# Patient Record
Sex: Female | Born: 1985
Health system: Southern US, Community
[De-identification: ages and names within clinical notes are randomized; demographics above are authoritative.]

## PROBLEM LIST (undated history)

## (undated) DIAGNOSIS — R300 Dysuria: Secondary | ICD-10-CM

## (undated) DIAGNOSIS — J189 Pneumonia, unspecified organism: Secondary | ICD-10-CM

## (undated) DIAGNOSIS — N3010 Interstitial cystitis (chronic) without hematuria: Secondary | ICD-10-CM

## (undated) DIAGNOSIS — U071 COVID-19: Secondary | ICD-10-CM

## (undated) DIAGNOSIS — R519 Headache, unspecified: Secondary | ICD-10-CM

## (undated) DIAGNOSIS — J40 Bronchitis, not specified as acute or chronic: Secondary | ICD-10-CM

## (undated) DIAGNOSIS — Z8711 Personal history of peptic ulcer disease: Secondary | ICD-10-CM

## (undated) DIAGNOSIS — F410 Panic disorder [episodic paroxysmal anxiety] without agoraphobia: Secondary | ICD-10-CM

## (undated) DIAGNOSIS — R32 Unspecified urinary incontinence: Secondary | ICD-10-CM

## (undated) HISTORY — PX: PILONIDAL CYST EXCISION: SHX744

## (undated) HISTORY — PX: BREAST SURGERY: SHX581

---
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
## 2003-02-02 ENCOUNTER — Encounter: Payer: Self-pay | Admitting: Family Medicine

## 2003-02-02 ENCOUNTER — Encounter: Admission: RE | Admit: 2003-02-02 | Discharge: 2003-02-02 | Payer: Self-pay | Admitting: Family Medicine

## 2003-02-27 ENCOUNTER — Emergency Department (HOSPITAL_COMMUNITY): Admission: EM | Admit: 2003-02-27 | Discharge: 2003-02-28 | Payer: Self-pay | Admitting: Emergency Medicine

## 2003-02-28 ENCOUNTER — Encounter: Payer: Self-pay | Admitting: *Deleted

## 2003-03-15 ENCOUNTER — Encounter: Payer: Self-pay | Admitting: Family Medicine

## 2003-03-15 ENCOUNTER — Encounter: Admission: RE | Admit: 2003-03-15 | Discharge: 2003-03-15 | Payer: Self-pay | Admitting: Family Medicine

## 2003-03-23 ENCOUNTER — Encounter: Admission: RE | Admit: 2003-03-23 | Discharge: 2003-03-23 | Payer: Self-pay | Admitting: Gastroenterology

## 2003-03-23 ENCOUNTER — Encounter: Payer: Self-pay | Admitting: Gastroenterology

## 2003-11-14 ENCOUNTER — Encounter (INDEPENDENT_AMBULATORY_CARE_PROVIDER_SITE_OTHER): Payer: Self-pay | Admitting: Specialist

## 2003-11-14 ENCOUNTER — Ambulatory Visit (HOSPITAL_COMMUNITY): Admission: RE | Admit: 2003-11-14 | Discharge: 2003-11-14 | Payer: Self-pay | Admitting: Obstetrics and Gynecology

## 2004-03-07 ENCOUNTER — Other Ambulatory Visit: Admission: RE | Admit: 2004-03-07 | Discharge: 2004-03-07 | Payer: Self-pay | Admitting: Obstetrics and Gynecology

## 2004-06-07 ENCOUNTER — Emergency Department (HOSPITAL_COMMUNITY): Admission: EM | Admit: 2004-06-07 | Discharge: 2004-06-08 | Payer: Self-pay | Admitting: Family Medicine

## 2006-05-19 ENCOUNTER — Encounter: Admission: RE | Admit: 2006-05-19 | Discharge: 2006-05-19 | Payer: Self-pay | Admitting: Family Medicine

## 2006-09-17 ENCOUNTER — Emergency Department (HOSPITAL_COMMUNITY): Admission: EM | Admit: 2006-09-17 | Discharge: 2006-09-17 | Payer: Self-pay | Admitting: Emergency Medicine

## 2008-09-16 ENCOUNTER — Encounter: Admission: RE | Admit: 2008-09-16 | Discharge: 2008-09-16 | Payer: Self-pay | Admitting: Neurology

## 2008-11-29 ENCOUNTER — Emergency Department (HOSPITAL_COMMUNITY): Admission: EM | Admit: 2008-11-29 | Discharge: 2008-11-29 | Payer: Self-pay | Admitting: Emergency Medicine

## 2011-01-23 NOTE — Op Note (Signed)
NAME:  Dana Giles, TECSON                        ACCOUNT NO.:  [id number]   MEDICAL RECORD NO.:  [id number]                   PATIENT TYPE:  AMB   LOCATION:  SDC                                  FACILITY:  [facility]   PHYSICIAN:  Dineen Kid. Rana Snare, M.D.                 DATE OF BIRTH:  1986/01/15   DATE OF PROCEDURE:  11/14/2003  DATE OF DISCHARGE:                                 OPERATIVE REPORT   PREOPERATIVE DIAGNOSIS:  Intrauterine pregnancy at seven weeks, desires  termination of pregnancy.   POSTOPERATIVE DIAGNOSIS:  Intrauterine pregnancy at seven weeks, desires  termination of pregnancy.   PROCEDURE:  Dilation and evacuation.   SURGEON:  Dineen Kid. Rana Snare, M.D.   ANESTHESIA:  Monitored anesthesia care and paracervical block.   INDICATIONS FOR PROCEDURE:  Ms. Ourada is a 25 year old primigravida who  finds herself unexpectedly pregnant.  She was counseled extensively on  options whether to carry the pregnancy, put the baby up for adoption or  terminate the pregnancy.  At this point, she desires termination of  pregnancy.  Risks and benefits of the procedure were discussed at length  which include, but not limited to, risk of infection, bleeding, damage to  uterus, tubes, ovaries bowel or bladder.  Her blood type is B positive.  She  does give informed consent.   DESCRIPTION OF PROCEDURE:  After adequate analgesia, the patient placed in  the dorsal lithotomy position.  She was sterilely prepped and draped.  Bladder was sterilely drained.  Graves speculum was placed.  Tenaculum was  placed in the anterior lip of the cervix and paracervical block was placed  with 1% Xylocaine, 1:100,000 epinephrine, 20 mL total used.  Uterus sounded  to 10 cm, easily dilated to a #29 Pratt dilator, 8 mm suction curette was  inserted.  Products of conception were retrieved.  This was performed until  the endometrial cavity was empty and gritty surface felt throughout the  endometrial cavity when sounding  with the curette.  Methergine 0.2 mg IM was  given with good uterine response.  The curette was then removed.  Tenaculum  removed from the cervix and noted to be hemostatic.  Speculum was then  removed.  The patient was transferred to the recovery room in stable  condition.  The estimated blood loss was less than 20 mL.  The patient also  received Toradol 30 mg IV at the end of the procedure.   DISPOSITION:  The patient will be discharged to home.  She will follow up in  the office in two to three weeks.  Sent home with a routine instruction  sheet for D&C and a prescription for Methergine 0.2 mg to take every eight  hours for two days and doxycycline 100 mg p.o. b.i.d. x7 days.  She is to  return to the office in two to three weeks. Call or return for increased  pain, fever,  or bleeding.                                               Dineen Kid Rana Snare, M.D.    DCL/MEDQ  D:  11/14/2003  T:  11/15/2003  Job:  [id number]

## 2011-01-23 NOTE — Op Note (Signed)
[facility]  Patient:    Dana Giles, Dana Giles                     MRN: [id number] Proc. Date: 04/13/01 Adm. Date:  [id number] Attending:  Trevor Iha                           Operative Report  PREOPERATIVE DIAGNOSIS:       Pelvic pain.  POSTOPERATIVE DIAGNOSIS:      Pelvic pain.  PROCEDURE:                    Diagnostic laparoscopy.  SURGEON:                      Trevor Iha, M.D.  ANESTHESIA:                   General endotracheal.  ESTIMATED BLOOD LOSS:         Less than 10 cc.  INDICATIONS:                  Ms. Perfetti is a 25 year old, nulligravida white female with worsening pelvic pain, intermittent and stabbing nature, and normal ultrasound and pelvic exam. Because of persistent and worsening symptoms despite conservative management with anti-inflammatory medications and oral contraceptive agents, plan laparoscopic evaluation and/or treatment of this discomfort. Risk and benefits were discussed at length. Informed consent was obtained. See history and physical for further details.  FINDINGS:                     Normal pelvis, appendix, liver, and gallbladder. The uterus was normal size, shape, and contour. The ovaries and tubes were very normal. Ovarian fossa, cul-de-sac, uterosacral ligaments were free to endometriosis, normal in appearance. Bowel: the descending colon did have some nodularity consistent with constipation.  DESCRIPTION OF PROCEDURE:     After adequate analgesia, the patient was placed in dorsal lithotomy position. She was sterilely prepped and draped. The bladder was sterilely drained. A Hulka tenaculum was placed on the anterior lip of the cervix. A 1 cm infraumbilical skin incision was made. A Veress needle was inserted. The abdomen was insufflated to dullness percussion. The 11 mm trocar was inserted. The 5 mm trocar was inserted to the left of the midline under direct visualization. It was two  fingerbreadths above the pubis symphysis. Examination of the intra-abdominal contents revealed the above findings, which were normal in appearance. After careful examination, the trocar was removed, the abdomen was desufflated. The infraumbilical skin incision was closed with a 0 Vicryl in the fascia and a figure-of-eight suture, and a 3-0 Vicryl Rapide subcuticular stitch in the skin. The 5 mm trocar site was closed with similar fashion with a 0 Vicryl in the fascia and a 3-0 Vicryl Rapide subcuticular stitch in the skin. Both incisions were injected with 0.25% Marcaine. The Hulka tenaculum was removed and noted to be hemostatic. The patient tolerated the procedure well, was stable, and transferred to recovery room. Sponge, needle, and instrument count was normal x 3. Estimated blood loss was less than 10 cc.  DISPOSITION:                  The patient will be discharged home to follow up in the office in two to three weeks. She was sent home with a routine instruction sheet for laparoscopy. She already has  a prescription for Ibuprofen and Darvocet. DD:  04/13/01 TD:  04/13/01 Job: [id number]

## 2011-01-23 NOTE — Consult Note (Signed)
NAME:  Dana Giles, Dana Giles              ACCOUNT NO.:  [id number]   MEDICAL RECORD NO.:  [id number]          PATIENT TYPE:  EMS   LOCATION:  MAJO                         FACILITY:  [facility]   PHYSICIAN:  Corinna L. Lendell Caprice, MDDATE OF BIRTH:  March 20, 1986   DATE OF CONSULTATION:  09/17/2006  DATE OF DISCHARGE:                                 CONSULTATION   REQUESTING PHYSICIAN:  Shelda Jakes, M.D.   REASON FOR CONSULTATION:  Multiple medication ingestion, evaluate for  admission.   IMPRESSIONS/RECOMMENDATIONS:  1. Multiple medication ingestion resulting in altered mental status,      improved.  Dr. Deretha Emory was concerned in particular bowel be      acetaminophen ingestion.  I spoke again with Poison Control about      her ingestions and given the fact that her liver function tests,      coagulation panel, and Tylenol level are normal they report that      the patient does not need Mucomyst.  She has received a single dose      here in the emergency room but we will not give her any further.      Her acetaminophen level is approximately 13-hour level and it is      not toxic.  With respect to the other ingestions including      chlorpheniramine, dextromethorphan, Ambien, phenylephrine, and      Mucinex, Poison Control recommends only monitoring until her blood      pressure and heart rate normalize.  Initially, her blood pressure      was 159/96 and her pulse was 140.  Currently, her blood pressure is      135/80, and her heart rate is 100.  The patient is alert and      oriented.  She is able to ambulate and wishes to go home.  I feel      that this can be managed at home as long as she takes no further      Tylenol products or cough and cold products.  She did have some      nausea but this is better.  I will give 12.5 mg of Phenergan as      needed, 6 were dispense but I instructed her to use it only if she      is symptomatic.  2. Acute sinusitis.  I recommend Afrin for 3-5 days  and followup with      Dr. Foy Guadalajara next week.   HISTORY OF PRESENT ILLNESS:  [name] is a 25 year old white female  who was brought to the emergency room by her mother.  She reportedly has  had a lot of sinus pressure, congestion, rhinorrhea, postnasal drip and  felt she had a head cold.  She took one Vicodin yesterday for shoulder  pain.  She also took two __________  Cold and Sinus medications.  She  took an Ambien last night and then does not recall what happened after  that.  The mother noted that she had an empty box of Tylenol product  which she had just bought yesterday, that 80  tablets were missing of  Tylenol 325, chlorpheniramine 2 mg, dextromethorphan 10, phenylephrine  5.  The mother reports she also took two Mucinex D yesterday and there  were 10 Ambien tablets missing that were just filled yesterday.  She  also had an empty bottle of Delsym which is 148 mL.  Apparently, there  is 30 mg of dextromethorphan per 5 mL.  The patient reports that she  does sometimes make calls and sleep walk when she takes the Ambien but  has continued to do so due to insomnia.  She vomited once this morning.  She feels a little weak and slightly nauseated but his back to her same  mental status according to the mother.  She denies any suicidal  ideation.  Her main complaint is the sinus issues.  She has already been  up and about to the bathroom without difficulty twice.   PAST MEDICAL HISTORY:  1. She has had a breast reduction.  2. She has chronic pain issues, migraines.   MEDICATIONS:  As above, otherwise none.   NO KNOWN DRUG ALLERGIES.   SOCIAL HISTORY:  She smokes __________ pack of cigarettes every week.  She drinks occasionally.  She denies drug use.   FAMILY HISTORY:  Noncontributory.   REVIEW OF SYSTEMS:  As above, otherwise negative.   PHYSICAL EXAMINATION:  VITAL SIGNS:  Her temperature is 97.8, blood  pressure initially 159/96, currently 130/80, pulse initially 140,   currently 100, oxygen saturation 97%.  GENERAL:  The patient is a somewhat groggy-appearing white female.  HEENT:  Normocephalic, atraumatic.  Pupils equal, round, reactive to  light.  Sclera nonicteric.  Moist mucous membranes.  Tympanic membranes  are clear.  She has boggy turbinates with a lot of drainage.  Oropharynx  is without erythema or exudate.  NECK:  Supple.  No lymphadenopathy.  LUNGS:  Clear to auscultation bilaterally without wheezes, rhonchi or  rales.  CARDIOVASCULAR:  Regular rate and rhythm without murmurs, gallops or  rubs.  ABDOMEN:  Normal bowel sounds, soft, nontender, nondistended.  GU:  Deferred.  RECTAL:  Deferred.  EXTREMITIES:  No clubbing, cyanosis or edema.  SKIN:  No rash.  PSYCHIATRIC:  Calm and cooperative.  Good eye contact.  Normal affect.  NEUROLOGIC:  Oriented x3.  Cranial nerves and sensory motor exam are  intact.   LABORATORY:  Hemoglobin/hematocrit normal.  PT/INR normal.  Complete  metabolic panel with normal.  Urine pregnancy negative.  Acetaminophen  less than 10, salicylate less than 4.  Urine drug screen positive for  amphetamines and opiates.  UA is negative.  Blood alcohol less than 5.      Corinna L. Lendell Caprice, MD  Electronically Signed     CLS/MEDQ  D:  09/17/2006  T:  09/17/2006  Job:  [id number]   cc:   Molly Maduro L. Foy Guadalajara, M.D.

## 2011-01-23 NOTE — H&P (Signed)
[facility]  Patient:    Dana Giles, Dana Giles                       MRN: [id number] Attending:  Trevor Iha, M.D.                         History and Physical  DATE OF BIRTH:                04-27-1986  HISTORY OF PRESENT ILLNESS:   The patient is a 25 year old nulligravida white female with severe pelvic pain.  She has had worsening pelvic pain which she voices as intermittent and stabbing and often incapacitating which recently required being taken out of school in a wheelchair because the pain is so bad. This lasted for one to two days.  She has been taking anti-inflammatory medications, currently on birth control pills which appeared to not be helping.  She presents today for a laparoscopic evaluation and treatment of this.  PAST MEDICAL HISTORY:         Negative.  PAST SURGICAL HISTORY:        Negative.  PAST GYN HISTORY:             No history of sexually transmitted diseases or abnormal Pap smears.  The patient denies sexual activity.  MEDICATIONS:                  Motrin, Darvocet, and Ortho-Cyclen.  ALLERGIES:                    No known drug allergies.  PHYSICAL EXAMINATION:  VITAL SIGNS:                  Blood pressure is 100/60.  HEART:                        Regular rate and rhythm.  LUNGS:                        Clear to auscultation bilaterally.  ABDOMEN:                      Nondistended and nontender.  PELVIC EXAMINATION:           The uterus is anteverted, mobile, nontender.  No adnexal masses or uterosacral nodularity is noted.  LABORATORY DATA:              Ultrasound shows uterus 6.5 x 3.4 x 4.6 cm. Normal endometrial stripes.  No masses or fibroids are seen.  The ovaries are within normal limits.  IMPRESSION:                   Pelvic pain, not improving on birth control                               pills or anti-inflammatory medications.  PLAN:                         The patient and her mother desire  definitive surgical intervention evaluation and plan to proceed with laparoscopy with CO2 laser for the possibility of endometriosis.  The risks and benefits were discussed at length with the patient and her mother which include, but not limited to risks  of infection, bleeding, damage of bowel, bladder, uterus, tubes, and ovaries, possibility of not alleviating the pain, risks also associated with blood transfusion.  Both the patient and her mother give their informed consent. DD:  04/13/01 TD:  04/13/01 Job: [id number]

## 2011-01-23 NOTE — Op Note (Signed)
[facility]  Patient:    Dana Giles, Dana Giles Visit Number: [id number] MRN: [id number]          Service Type: DSU Location: [facility] Attending Physician:  Vikki Ports Dictated by:   Earna Coder, M.D. Proc. Date: 07/26/01 Admit Date:  07/26/2001                             Operative Report  PREOPERATIVE DIAGNOSIS:  Pilonidal cyst and sinus.  POSTOPERATIVE DIAGNOSIS:  Pilonidal cyst and sinus.  OPERATION PERFORMED:  Pilonidal cystectomy.  SURGEON:  Stephenie Acres, M.D.  ANESTHESIA:  General.  DESCRIPTION OF PROCEDURE:  The patient was taken to the operating room and placed in supine position.  After adequate anesthesia was induced using endotracheal tube. the patient was placed in the prone jack knife position. The superior gluteal fold was prepped and draped in normal sterile fashion. There were four sinus openings spread over about a six inch area.  These were all probed using lacrimal duct probes and found all to go toward the midline toward the midline toward the sacral fascia.  Elliptical incision was made to encompass all sinus openings.  I dissected down in all directions down onto the gluteus musculature and down to the periosteum.  All tissue was removed en bloc.  Adequate hemostasis was ensured.  A 10 French drain was placed through a separate stab wound.  The defect was closed with running 2-0 Vicryl suture. Skin was closed with interrupted vertical mattress sutures and staples. Sterile dressing was applied.  The patient tolerated the procedure well and went to PACU in good condition. Dictated by:   Earna Coder, M.D. Attending Physician:  Danna Hefty R. DD:  07/26/01 TD:  07/26/01 Job: [id number]

## 2011-07-02 ENCOUNTER — Encounter (HOSPITAL_BASED_OUTPATIENT_CLINIC_OR_DEPARTMENT_OTHER): Payer: Self-pay

## 2011-07-02 ENCOUNTER — Other Ambulatory Visit (HOSPITAL_BASED_OUTPATIENT_CLINIC_OR_DEPARTMENT_OTHER): Payer: Self-pay | Admitting: Family Medicine

## 2011-07-02 ENCOUNTER — Ambulatory Visit (HOSPITAL_BASED_OUTPATIENT_CLINIC_OR_DEPARTMENT_OTHER)
Admission: RE | Admit: 2011-07-02 | Discharge: 2011-07-02 | Disposition: A | Discharge status: Home / Self Care | Payer: PRIVATE HEALTH INSURANCE | Source: Ambulatory Visit | Attending: Family Medicine | Admitting: Family Medicine

## 2011-07-02 DIAGNOSIS — R109 Unspecified abdominal pain: Secondary | ICD-10-CM

## 2011-07-02 DIAGNOSIS — K5289 Other specified noninfective gastroenteritis and colitis: Secondary | ICD-10-CM | POA: Insufficient documentation

## 2011-07-02 DIAGNOSIS — Q619 Cystic kidney disease, unspecified: Secondary | ICD-10-CM | POA: Insufficient documentation

## 2011-07-02 MED ORDER — IOHEXOL 300 MG/ML  SOLN
100.0000 mL | Freq: Once | INTRAMUSCULAR | Status: AC | PRN
  Administered 2011-07-02: 100 mL via INTRAVENOUS

## 2011-12-26 ENCOUNTER — Encounter (HOSPITAL_COMMUNITY): Payer: Self-pay | Admitting: *Deleted

## 2011-12-26 ENCOUNTER — Emergency Department (HOSPITAL_COMMUNITY)
Admission: EM | Admit: 2011-12-26 | Discharge: 2011-12-26 | Disposition: A | Discharge status: Home / Self Care | Payer: Worker's Compensation | Attending: Emergency Medicine | Admitting: Emergency Medicine

## 2011-12-26 DIAGNOSIS — M25519 Pain in unspecified shoulder: Secondary | ICD-10-CM | POA: Insufficient documentation

## 2011-12-26 DIAGNOSIS — Z79899 Other long term (current) drug therapy: Secondary | ICD-10-CM | POA: Insufficient documentation

## 2011-12-26 DIAGNOSIS — M549 Dorsalgia, unspecified: Secondary | ICD-10-CM | POA: Insufficient documentation

## 2011-12-26 DIAGNOSIS — S46811A Strain of other muscles, fascia and tendons at shoulder and upper arm level, right arm, initial encounter: Secondary | ICD-10-CM

## 2011-12-26 DIAGNOSIS — S43499A Other sprain of unspecified shoulder joint, initial encounter: Secondary | ICD-10-CM | POA: Insufficient documentation

## 2011-12-26 DIAGNOSIS — M79609 Pain in unspecified limb: Secondary | ICD-10-CM | POA: Insufficient documentation

## 2011-12-26 DIAGNOSIS — X58XXXA Exposure to other specified factors, initial encounter: Secondary | ICD-10-CM | POA: Insufficient documentation

## 2011-12-26 DIAGNOSIS — F17200 Nicotine dependence, unspecified, uncomplicated: Secondary | ICD-10-CM | POA: Insufficient documentation

## 2011-12-26 DIAGNOSIS — M539 Dorsopathy, unspecified: Secondary | ICD-10-CM | POA: Insufficient documentation

## 2011-12-26 MED ORDER — OXYCODONE-ACETAMINOPHEN 5-325 MG PO TABS: 1.0000 | ORAL_TABLET | Freq: Four times a day (QID) | ORAL | Status: AC | PRN

## 2011-12-26 MED ORDER — CYCLOBENZAPRINE HCL 10 MG PO TABS: 10.0000 mg | ORAL_TABLET | Freq: Two times a day (BID) | ORAL | Status: AC | PRN

## 2011-12-26 MED ORDER — OXYCODONE-ACETAMINOPHEN 5-325 MG PO TABS
1.0000 | ORAL_TABLET | Freq: Once | ORAL | Status: AC
  Administered 2011-12-26: 1 via ORAL
  Filled 2011-12-26: qty 1

## 2011-12-26 NOTE — ED Provider Notes (Addendum)
History     CSN: [id number]  Arrival date & time 12/26/11  1641   First MD Initiated Contact with Patient 12/26/11 1818      Chief Complaint  Patient presents with  . Back Pain    (Consider location/radiation/quality/duration/timing/severity/associated sxs/prior treatment) Patient is a 26 y.o. female presenting with back pain. The history is provided by the patient.  Back Pain  This is a new problem. The current episode started 6 to 12 hours ago. The problem occurs constantly. The problem has been gradually worsening. The pain is associated with lifting heavy objects. Pain location: Right trapezius. The quality of the pain is described as stabbing, shooting and aching. The pain does not radiate. The pain is at a severity of 9/10. The pain is severe. The symptoms are aggravated by twisting and certain positions. The pain is the same all the time. Stiffness is present all day. Pertinent negatives include no paresis, no tingling and no weakness. She has tried NSAIDs and muscle relaxants for the symptoms. The treatment provided no relief.    History reviewed. No pertinent past medical history.  Past Surgical History  Procedure Date  . Breast surgery     History reviewed. No pertinent family history.  History  Substance Use Topics  . Smoking status: Current Everyday Smoker -- 0.5 packs/day    Types: Cigarettes  . Smokeless tobacco: Not on file  . Alcohol Use: No    OB History    Grav Para Term Preterm Abortions TAB SAB Ect Mult Living                  Review of Systems  Musculoskeletal: Positive for back pain.  Neurological: Negative for tingling and weakness.  All other systems reviewed and are negative.    Allergies  Review of patient's allergies indicates no known allergies.  Home Medications   Current Outpatient Rx  Name Route Sig Dispense Refill  . EXCEDRIN MIGRAINE PO Oral Take 2 tablets by mouth daily as needed. For pain    . BACLOFEN 10 MG PO TABS Oral  Take 10 mg by mouth 3 (three) times daily.    Marland Kitchen ESTRADIOL CYPIONATE 5 MG/ML IM OIL Intramuscular Inject 2 mg into the muscle every 28 (twenty-eight) days.      BP 117/80  Pulse 113  Temp(Src) 97.9 F (36.6 C) (Oral)  Resp 22  SpO2 99%  Physical Exam  Nursing note and vitals reviewed. Constitutional: She is oriented to person, place, and time. She appears well-developed and well-nourished. She appears distressed.  HENT:  Head: Normocephalic and atraumatic.  Eyes: EOM are normal. Pupils are equal, round, and reactive to light.  Neck: Muscular tenderness present. No spinous process tenderness present.    Cardiovascular: Intact distal pulses.   Musculoskeletal:       Lumbar back: Normal.  Neurological: She is alert and oriented to person, place, and time. She has normal strength. No sensory deficit.  Skin: Skin is warm and dry. No rash noted. No erythema.    ED Course  Procedures (including critical care time)  Labs Reviewed - No data to display No results found.   1. 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 Today she started having spasm in her right trapezius area. She denies any trauma and has pain in the distribution of the right trapezius. She was neurovascularly intact and has a normal pulse.  Will treat symptomatically.        Gwyneth Sprout, MD 12/26/11 1610  Gwyneth Sprout, MD 12/26/11 9604

## 2011-12-26 NOTE — ED Notes (Signed)
To ED for eval of upper back pain and shoulder pain since working today. 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 Pain is mainly in right upper shoulder and radiates down right side, per pt.

## 2012-02-28 ENCOUNTER — Ambulatory Visit: Payer: Self-pay | Admitting: Family Medicine

## 2012-02-28 VITALS — BP 106/70 | HR 105 | Temp 98.8°F | Resp 16 | Ht 63.5 in | Wt 138.0 lb

## 2012-02-28 DIAGNOSIS — J209 Acute bronchitis, unspecified: Secondary | ICD-10-CM

## 2012-02-28 MED ORDER — AZITHROMYCIN 250 MG PO TABS: ORAL_TABLET | ORAL | Status: AC

## 2012-02-28 MED ORDER — HYDROCODONE-HOMATROPINE 5-1.5 MG/5ML PO SYRP: 5.0000 mL | ORAL_SOLUTION | Freq: Three times a day (TID) | ORAL | Status: AC | PRN

## 2012-02-28 MED ORDER — ALBUTEROL SULFATE HFA 108 (90 BASE) MCG/ACT IN AERS: 2.0000 | INHALATION_SPRAY | Freq: Four times a day (QID) | RESPIRATORY_TRACT | Status: DC | PRN

## 2012-02-28 NOTE — Progress Notes (Signed)
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 She's had 48 hours of acute cough which has been quite violent at times. She had to leave work today to come in and get evaluated. She has no history of asthma and she does not smoke, but her parents both smoke and she lives at home.  She's had no fever or shortness of breath.  Objective no acute distress HEENT: Unremarkable with exception of mild posterior pharyngeal erythema Chest: Few expiratory wheezes without shortness of breath Skin: Warm and dry without cyanosis Heart: Regular no murmur or gallop  Assessment: Acute bronchitis  Plan: 1. Bronchitis, acute  HYDROcodone-homatropine (HYCODAN) 5-1.5 MG/5ML syrup, albuterol (PROVENTIL HFA;VENTOLIN HFA) 108 (90 BASE) MCG/ACT inhaler, azithromycin (ZITHROMAX Z-PAK) 250 MG tablet

## 2012-02-28 NOTE — Patient Instructions (Addendum)

## 2012-08-22 DIAGNOSIS — F419 Anxiety disorder, unspecified: Secondary | ICD-10-CM | POA: Insufficient documentation

## 2012-08-22 DIAGNOSIS — F329 Major depressive disorder, single episode, unspecified: Secondary | ICD-10-CM | POA: Insufficient documentation

## 2012-08-22 DIAGNOSIS — G8929 Other chronic pain: Secondary | ICD-10-CM | POA: Insufficient documentation

## 2013-07-25 DIAGNOSIS — G43909 Migraine, unspecified, not intractable, without status migrainosus: Secondary | ICD-10-CM | POA: Insufficient documentation

## 2014-03-07 ENCOUNTER — Encounter (HOSPITAL_COMMUNITY): Payer: Self-pay | Admitting: Emergency Medicine

## 2014-03-07 ENCOUNTER — Emergency Department (HOSPITAL_COMMUNITY)
Admission: EM | Admit: 2014-03-07 | Discharge: 2014-03-07 | Disposition: A | Discharge status: Home / Self Care | Payer: BC Managed Care – PPO | Attending: Emergency Medicine | Admitting: Emergency Medicine

## 2014-03-07 DIAGNOSIS — Z79899 Other long term (current) drug therapy: Secondary | ICD-10-CM | POA: Insufficient documentation

## 2014-03-07 DIAGNOSIS — T43605A Adverse effect of unspecified psychostimulants, initial encounter: Secondary | ICD-10-CM | POA: Insufficient documentation

## 2014-03-07 DIAGNOSIS — Z87891 Personal history of nicotine dependence: Secondary | ICD-10-CM | POA: Insufficient documentation

## 2014-03-07 DIAGNOSIS — F411 Generalized anxiety disorder: Secondary | ICD-10-CM | POA: Diagnosis not present

## 2014-03-07 DIAGNOSIS — IMO0001 Reserved for inherently not codable concepts without codable children: Secondary | ICD-10-CM | POA: Insufficient documentation

## 2014-03-07 DIAGNOSIS — R000 Tachycardia, unspecified: Secondary | ICD-10-CM | POA: Diagnosis not present

## 2014-03-07 DIAGNOSIS — Z3202 Encounter for pregnancy test, result negative: Secondary | ICD-10-CM | POA: Insufficient documentation

## 2014-03-07 HISTORY — DX: Panic disorder (episodic paroxysmal anxiety): F41.0

## 2014-03-07 LAB — URINALYSIS, ROUTINE W REFLEX MICROSCOPIC
BILIRUBIN URINE: NEGATIVE
Glucose, UA: NEGATIVE mg/dL
HGB URINE DIPSTICK: NEGATIVE
Ketones, ur: NEGATIVE mg/dL
Leukocytes, UA: NEGATIVE
Nitrite: NEGATIVE
Protein, ur: NEGATIVE mg/dL
SPECIFIC GRAVITY, URINE: 1.008 (ref 1.005–1.030)
UROBILINOGEN UA: 0.2 mg/dL (ref 0.0–1.0)
pH: 5 (ref 5.0–8.0)

## 2014-03-07 LAB — RAPID URINE DRUG SCREEN, HOSP PERFORMED
AMPHETAMINES: POSITIVE — AB
BENZODIAZEPINES: NOT DETECTED
Barbiturates: NOT DETECTED
Cocaine: NOT DETECTED
OPIATES: NOT DETECTED
TETRAHYDROCANNABINOL: NOT DETECTED

## 2014-03-07 LAB — BASIC METABOLIC PANEL
BUN: 6 mg/dL (ref 6–23)
CALCIUM: 9.4 mg/dL (ref 8.4–10.5)
CO2: 22 mEq/L (ref 19–32)
CREATININE: 0.81 mg/dL (ref 0.50–1.10)
Chloride: 105 mEq/L (ref 96–112)
Glucose, Bld: 87 mg/dL (ref 70–99)
POTASSIUM: 3.5 meq/L — AB (ref 3.7–5.3)
Sodium: 145 mEq/L (ref 137–147)

## 2014-03-07 LAB — CBC WITH DIFFERENTIAL/PLATELET
BASOS ABS: 0 10*3/uL (ref 0.0–0.1)
BASOS PCT: 0 % (ref 0–1)
EOS ABS: 0.2 10*3/uL (ref 0.0–0.7)
Eosinophils Relative: 2 % (ref 0–5)
HCT: 39.6 % (ref 36.0–46.0)
Hemoglobin: 13.6 g/dL (ref 12.0–15.0)
Lymphocytes Relative: 34 % (ref 12–46)
Lymphs Abs: 3.2 10*3/uL (ref 0.7–4.0)
MCH: 29.9 pg (ref 26.0–34.0)
MCHC: 34.3 g/dL (ref 30.0–36.0)
MCV: 87 fL (ref 78.0–100.0)
Monocytes Absolute: 0.5 10*3/uL (ref 0.1–1.0)
Monocytes Relative: 5 % (ref 3–12)
NEUTROS PCT: 59 % (ref 43–77)
Neutro Abs: 5.7 10*3/uL (ref 1.7–7.7)
PLATELETS: 386 10*3/uL (ref 150–400)
RBC: 4.55 MIL/uL (ref 3.87–5.11)
RDW: 12.6 % (ref 11.5–15.5)
WBC: 9.6 10*3/uL (ref 4.0–10.5)

## 2014-03-07 LAB — ETHANOL: Alcohol, Ethyl (B): 67 mg/dL — ABNORMAL HIGH (ref 0–11)

## 2014-03-07 LAB — PREGNANCY, URINE: Preg Test, Ur: NEGATIVE

## 2014-03-07 LAB — I-STAT TROPONIN, ED: TROPONIN I, POC: 0 ng/mL (ref 0.00–0.08)

## 2014-03-07 MED ORDER — LORAZEPAM 2 MG/ML IJ SOLN
1.0000 mg | Freq: Once | INTRAMUSCULAR | Status: AC
  Administered 2014-03-07: 1 mg via INTRAVENOUS
  Filled 2014-03-07: qty 1

## 2014-03-07 MED ORDER — MORPHINE SULFATE 4 MG/ML IJ SOLN
4.0000 mg | Freq: Once | INTRAMUSCULAR | Status: AC
  Administered 2014-03-07: 4 mg via INTRAVENOUS
  Filled 2014-03-07: qty 1

## 2014-03-07 MED ORDER — SODIUM CHLORIDE 0.9 % IV BOLUS (SEPSIS)
1000.0000 mL | Freq: Once | INTRAVENOUS | Status: AC
Start: 2014-03-07 — End: 2014-03-07
  Administered 2014-03-07: 1000 mL via INTRAVENOUS

## 2014-03-07 NOTE — ED Notes (Signed)
Pt still anxious and restless; encouraged to get back in bed. PA aware.

## 2014-03-07 NOTE — ED Provider Notes (Signed)
CSN: [id number]     Arrival date & time 03/07/14  0441 History   First MD Initiated Contact with Patient 03/07/14 0606     Chief Complaint  Patient presents with  . Anxiety  . Palpitations     (Consider location/radiation/quality/duration/timing/severity/associated sxs/prior Treatment) HPI Comments: Patient is a 28 year old female with a past medical history of anxiety who presents with anxiety and palpitations. Patient reports symptoms started suddenly last night when she was hanging out with friends. She reports feeling nervous and also feeling like her heart was beating fast. Symptoms started suddenly and remained constant. Her friends became nervous and brought her to the ED. Patient reports feeling like this previously with anxiety attacks. She also reports feeling a heaviness in her chest last night that has since resolved. No other symptoms. Patient reports drinking a glass of wine last night and denies any drug use.    Past Medical History  Diagnosis Date  . Anxiety attack   . Panic attacks    Past Surgical History  Procedure Laterality Date  . Breast surgery     No family history on file. History  Substance Use Topics  . Smoking status: Former Smoker -- 0.50 packs/day    Types: Cigarettes  . Smokeless tobacco: Not on file  . Alcohol Use: No   OB History   Grav Para Term Preterm Abortions TAB SAB Ect Mult Living                 Review of Systems  Constitutional: Negative for fever, chills and fatigue.  HENT: Negative for trouble swallowing.   Eyes: Negative for visual disturbance.  Respiratory: Negative for shortness of breath.   Cardiovascular: Negative for chest pain and palpitations.  Gastrointestinal: Negative for nausea, vomiting, abdominal pain and diarrhea.  Genitourinary: Negative for dysuria and difficulty urinating.  Musculoskeletal: Positive for myalgias. Negative for arthralgias and neck pain.  Skin: Negative for color change.  Neurological: Negative  for dizziness and weakness.  Psychiatric/Behavioral: Negative for dysphoric mood. The patient is nervous/anxious.       Allergies  Review of patient's allergies indicates no known allergies.  Home Medications   Prior to Admission medications   Medication Sig Start Date End Date Taking? Authorizing Provider  albuterol (PROVENTIL HFA;VENTOLIN HFA) 108 (90 BASE) MCG/ACT inhaler Inhale 2 puffs into the lungs every 6 (six) hours as needed for wheezing or shortness of breath.   Yes Historical Provider, MD  estradiol cypionate (DEPO-ESTRADIOL) 5 MG/ML injection Inject 2 mg into the muscle every 28 (twenty-eight) days.   Yes Historical Provider, MD  sertraline (ZOLOFT) 25 MG tablet Take 25 mg by mouth daily.   Yes Historical Provider, MD   BP 108/63  Pulse 119  Resp 32  SpO2 96% Physical Exam  Nursing note and vitals reviewed. Constitutional: She is oriented to person, place, and time. She appears well-developed and well-nourished. No distress.  HENT:  Head: Normocephalic and atraumatic.  Eyes: Conjunctivae are normal.  Neck: Normal range of motion.  Cardiovascular: Regular rhythm.  Exam reveals no gallop and no friction rub.   No murmur heard. tachycardic  Pulmonary/Chest: Effort normal and breath sounds normal. She has no wheezes. She has no rales. She exhibits no tenderness.  Abdominal: Soft. She exhibits no distension. There is no tenderness. There is no rebound.  Musculoskeletal: Normal range of motion.  Neurological: She is alert and oriented to person, place, and time.  Speech is goal-oriented. Moves limbs without ataxia.   Skin:  Skin is warm and dry.  Psychiatric:  Anxious behavior    ED Course  Procedures (including critical care time) Labs Review Labs Reviewed  URINE RAPID DRUG SCREEN (HOSP PERFORMED) - Abnormal; Notable for the following:    Amphetamines POSITIVE (*)    All other components within normal limits  BASIC METABOLIC PANEL - Abnormal; Notable for the  following:    Potassium 3.5 (*)    All other components within normal limits  ETHANOL - Abnormal; Notable for the following:    Alcohol, Ethyl (B) 67 (*)    All other components within normal limits  URINALYSIS, ROUTINE W REFLEX MICROSCOPIC  PREGNANCY, URINE  CBC WITH DIFFERENTIAL  I-STAT TROPOININ, ED    Imaging Review No results found.   EKG Interpretation   Date/Time:  Wednesday March 07 2014 04:49:07 EDT Ventricular Rate:  119 PR Interval:  153 QRS Duration: 71 QT Interval:  350 QTC Calculation: 492 R Axis:   66 Text Interpretation:  Sinus tachycardia Nonspecific T abnormalities,  anterior leads Borderline prolonged QT interval No significant change  since last tracing Confirmed by OTTER  MD, OLGA ([id number]) on 03/07/2014  5:06:32 AM      MDM   Final diagnoses:  Adverse reaction to central nervous system stimulant, initial encounter    6:15 AM Labs show ethanol level at 67 and positive UDS for amphetamines. Patient's EKG shows nonspecific T abnormalities in anterior leads. Patient will have troponin.   7:00 AM Troponin is negative. Patient has no chest pain at this time.   9:43 AM Patient feeling better after receiving 2.5mg  IV ativan and 4mg  IV morphine. Patient is calm and her heart rate has improved. Patient will be discharged with instructions to follow up with a PCP from the resource guide. Patient likely having an adverse reaction to amphetamine.   Emilia BeckKaitlyn Adlai Nieblas, [place] JerseyPA-C 03/07/14 [phone]

## 2014-03-07 NOTE — ED Notes (Signed)
Pt complaining of generalized body pain. PA aware.

## 2014-03-07 NOTE — ED Notes (Signed)
Pt agitated and restless; admits to taking OTC focus enhancements. PA aware pt still agitated and "just wants to sleep"

## 2014-03-07 NOTE — ED Notes (Signed)
PT ambulated with baseline gait; VSS; A&Ox3; no signs of distress; respirations even and unlabored; skin warm and dry; no questions upon discharge.  

## 2014-03-07 NOTE — ED Notes (Signed)
Pt. arrived with EMS from friends home reports anxiety attack with intermittent palpitations onset this morning , pt. stated her grandfather died yesterday , pt. also reported bilateral leg cramps radiating to bilateral hips , + ETOH , CBG = 108. Denies chest pain / respirations unlabored.

## 2014-03-07 NOTE — ED Notes (Signed)
Pt resting and reports she is much more comfortable.

## 2014-03-07 NOTE — ED Provider Notes (Signed)
Medical screening examination/treatment/procedure(s) were performed by non-physician practitioner and as supervising physician I was immediately available for consultation/collaboration.   EKG Interpretation   Date/Time:  Wednesday March 07 2014 04:49:07 EDT Ventricular Rate:  119 PR Interval:  153 QRS Duration: 71 QT Interval:  350 QTC Calculation: 492 R Axis:   66 Text Interpretation:  Sinus tachycardia Nonspecific T abnormalities,  anterior leads Borderline prolonged QT interval No significant change  since last tracing Confirmed by Dalon Reichart  MD, Khushi Zupko ([id number]) on 03/07/2014  5:06:32 AM       Olivia Mackielga M Duwayne Matters, MD 03/07/14 [id number]

## 2014-03-07 NOTE — Discharge Instructions (Signed)
Follow up with a primary care provider from the resource guide below. Return to the ED with worsening or concerning symptoms.    Emergency Department Resource Guide 1) Find a Doctor and Pay Out of Pocket Although you won't have to find out who is covered by your insurance plan, it is a good idea to ask around and get recommendations. You will then need to call the office and see if the doctor you have chosen will accept you as a new patient and what types of options they offer for patients who are self-pay. Some doctors offer discounts or will set up payment plans for their patients who do not have insurance, but you will need to ask so you aren't surprised when you get to your appointment.  2) Contact Your Local Health Department Not all health departments have doctors that can see patients for sick visits, but many do, so it is worth a call to see if yours does. If you don't know where your local health department is, you can check in your phone book. The CDC also has a tool to help you locate your state's health department, and many state websites also have listings of all of their local health departments.  3) Find a Walk-in Clinic If your illness is not likely to be very severe or complicated, you may want to try a walk in clinic. These are popping up all over the country in pharmacies, drugstores, and shopping centers. They're usually staffed by nurse practitioners or physician assistants that have been trained to treat common illnesses and complaints. They're usually fairly quick and inexpensive. However, if you have serious medical issues or chronic medical problems, these are probably not your best option.  No Primary Care Doctor: - Call Health Connect at  [phone] - they can help you locate a primary care doctor that  accepts your insurance, provides certain services, etc. - Physician Referral Service- [phone]  Chronic Pain Problems: Organization         Address  Phone    Notes  [facility]  [phone] Patients need to be referred by their primary care doctor.   Medication Assistance: Organization         Address  Phone   Notes  [facility] [address] [place], [place] [address] [phone](336) [phone] --Must be a resident of [place] [facility] [place] -- Must have NO insurance coverage whatsoever (no Medicaid/ Medicare, etc.) -- The pt. MUST have a primary care doctor that directs their care regularly and follows them in the community   MedAssist  [phone]   [employer]  [phone]    Agencies that provide inexpensive medical care: Organization         Address  Phone   Notes  [facility]  [phone]) [phone]   [facility]    [phone]   [facility] [address] [place], [place] [address] [phone]) [phone]   [facility] [address] [place], [place] [phone](336) [phone]   [facility]anned Parenthood    [phone]   [facility]    [phone](336) [phone]   [facility]  [address], [place] Phone:  [phone](336) [phone], Fax:  [phone](336) [phone] Hours of Operation:  9 am - 6 pm, M-F.  Also accepts Medicaid/Medicare and self-pay.  [facility]  [address], [place] Phone: [phone](336) [phone],  Fax: [phone]. Hours of Operation:  8:30 am - 5:30 pm, M-F.  Also accepts Medicaid and self-pay.  [facility] [address], [place] Phone: [phone]   [place], [place], [place] [phone], Ext. [phone] Mondays & Thursdays: 7-9 AM.  First 15 patients are seen on a first come, first serve basis.    [place] Providers:  Organization         Address  Phone   Notes  [facility] [address],  [phone] Also accepts self-pay patients.  [facility]  [address], [place]  [phone]   [place], [address], [place] [phone]   [facility] [address], [place] [phone]   Lucianne Lei [address], [place]   [phone] Only accepts [place] Access [place] patients after they have their name applied to their card.   Self-Pay (no insurance) in [facility]:  Organization         Address  Phone   Notes  Sickle Cell Patients, [facility] [place] [phone]   [facility] [place] [phone]   [facility] [place]  [place], [place], [place] [phone]   [facility]/Dr. Osei-Bonsu  [address], [place] or [address], [place] [phone] Phone number for both [place] and [place] locations is the same.  [facility]gent Medical [facility] [address], [place] [phone]   [facility] [address] [place] or [address] [phone]   [facility] [address] [place] [phone], phone; [phone], fax Sees patients 1st and 3rd Saturday of every month.  Must not qualify for public or private insurance (i.e. Medicaid, Medicare, [place] Health Choice, Veterans' Benefits)  Household income should be no more than 200% of the poverty level The clinic cannot treat you if you are pregnant or think you are pregnant  Sexually transmitted diseases are not treated at the clinic.    Dental Care: Organization         Address  Phone  Notes  [facility] Department of [facility] [phone] Accepts children up to age 22 who are enrolled in [place] or [place]; pregnant women with a Medicaid card; and children who have  applied for Medicaid or [place] Health Choice, but were declined, whose parents can pay a reduced fee at time of service.  [facility] of [facility]  [address], [place] [phone] Accepts children up to age 33 who are enrolled in [place] or [place]; pregnant women with a Medicaid card; and children who have applied for Medicaid or [place] Health Choice, but were declined, whose parents can pay a reduced fee at time of service.  [place] Adult Dental Access PROGRAM  [place] [phone] Patients are seen by appointment only. Walk-ins are not accepted. [place] will see patients 13 years of age and older. Monday - Tuesday (8am-5pm) Most Wednesdays (8:30-5pm) $30 per visit, cash only  [place] [facility] [employer] PRO[facility]  [address], [employer] (  [phone] Patients are seen by appointment only. Walk-ins are not accepted. [place] will see patients 46 years of age and older. One Wednesday Evening (Monthly: Volunteer Based).  $30 per visit, cash only  [place]  [phone] for adults; Children under age 38, call Graduate Pediatric Dentistry at [phone]. Children aged 28-14, please call [phone] to request a pediatric application.  Dental services are provided in all areas of dental care including fillings, crowns and bridges, complete and partial dentures, implants, gum treatment, root canals, and extractions. Preventive care is also provided. Treatment is provided to both adults and children. Patients are selected via a lottery and there is often a waiting list.   [facility] [address] [place]  [phone] [email or web address]   [facility] [address] [place], [place] [phone], Ext. [phone] Second and Fourth Thursday of each month, opens at 6:30 AM; Clinic ends at 9 AM.  Patients are seen on a first-come first-served basis, and a  limited number are seen during each clinic.   [facility]  [address] Hillard Danker [place], [place] [phone]   Eligibility Requirements You must have lived in [place], [place], or [place] counties for at least the last three months.   You cannot be eligible for state or federal sponsored [employer], including [employer], [place], or [employer].   You generally cannot be eligible for healthcare insurance through your employer.    How to apply: Eligibility screenings are held every Tuesday and Wednesday afternoon from 1:00 pm until 4:00 pm. You do not need an appointment for the interview!  [facility] [address], [place], [place]   [place]  [place] Department  [place]  [phone]    Behavioral Health Resources in the Community: Intensive Outpatient Programs Organization         Address  Phone  Notes  [place]. [address], [place], [place] [phone]   [facility] [address] [place], [place]   ADS: Alcohol & Drug Svcs [address] [place], [place]   [place] [address],  [place], [place] or [phone]   Substance Abuse Resources Organization         Address  Phone  Notes  Alcohol and Drug Services  [phone]   [place]  [phone]   [facility] [place]   Chinita Pester  [phone]   Residential & Outpatient Substance Abuse Program  [phone]   Psychological Services Organization         Address  Phone  Notes  [facility]  [place]  [phone]   [place] [address] [place] or [phone]    Mobile Crisis Teams Organization          Address  Phone  Notes  [facility], Mobile Crisis Care Unit  [phone]   [facility]  [address] [place], [place]   [place] Levels [address], [place] [phone]    Self-Help/Support Groups Organization         Address  Phone             Notes  [place]. of [place] - variety of support groups  336- H3156881 Call for more information  Narcotics Anonymous (NA), [facility] [address], [employer] [place]  2 meetings at this location   Residential Facilities manager         Address  Phone  Notes  [facility] [place],    [place]  [phone]   [facility]  [address], [place] [id number], [place], [place]   [place], [place] [phone] Admissions: 8am-3pm M-F  Incentives Substance [place] [address]    [employer], [place] [phone]   [facility] [address] [place], [place], [place]   [facility] [address],  [place], [place]   [facility] Programs - Intensive Outpatient [address]een [address], [place], [place]   [facility] ([place].,  [place], [place] [phone] or [phone]   [facility] Services (RTS) [address], [place], [place] Accepts Medicaid  [facility] [address]  [place] [phone] Substance Abuse/Addiction Treatment   [facility] Organization         Address  Phone  Notes  [facility]  [phone]   Domenic Schwab, PhD [address] Arlis Porta [place], [place]   [phone] or [phone]   [place], [place] [phone]   [facility] [address], [place], [place] [phone] Insurance/Medicaid/sponsorship  through [facility] and Families [address]                                    [place], [place] [phone] [place] [address], [place] [phone]    Dr. Adele Schilder  [phone]   [facility] Dept. 1) [address] [place] 2) [place] 3)  [place] [address], [place] [phone]  [phone]   [place] [phone] or [phone] (After Hours)

## 2014-09-17 ENCOUNTER — Emergency Department (HOSPITAL_BASED_OUTPATIENT_CLINIC_OR_DEPARTMENT_OTHER)
Admission: EM | Admit: 2014-09-17 | Discharge: 2014-09-17 | Disposition: A | Discharge status: Home / Self Care | Payer: BLUE CROSS/BLUE SHIELD | Attending: Emergency Medicine | Admitting: Emergency Medicine

## 2014-09-17 ENCOUNTER — Encounter (HOSPITAL_BASED_OUTPATIENT_CLINIC_OR_DEPARTMENT_OTHER): Payer: Self-pay

## 2014-09-17 ENCOUNTER — Emergency Department (HOSPITAL_BASED_OUTPATIENT_CLINIC_OR_DEPARTMENT_OTHER): Payer: BLUE CROSS/BLUE SHIELD

## 2014-09-17 DIAGNOSIS — F410 Panic disorder [episodic paroxysmal anxiety] without agoraphobia: Secondary | ICD-10-CM | POA: Insufficient documentation

## 2014-09-17 DIAGNOSIS — R1110 Vomiting, unspecified: Secondary | ICD-10-CM | POA: Diagnosis not present

## 2014-09-17 DIAGNOSIS — Z79899 Other long term (current) drug therapy: Secondary | ICD-10-CM | POA: Insufficient documentation

## 2014-09-17 DIAGNOSIS — R058 Other specified cough: Secondary | ICD-10-CM

## 2014-09-17 DIAGNOSIS — R05 Cough: Secondary | ICD-10-CM | POA: Diagnosis present

## 2014-09-17 DIAGNOSIS — Z87891 Personal history of nicotine dependence: Secondary | ICD-10-CM | POA: Insufficient documentation

## 2014-09-17 DIAGNOSIS — J40 Bronchitis, not specified as acute or chronic: Secondary | ICD-10-CM | POA: Insufficient documentation

## 2014-09-17 HISTORY — DX: Bronchitis, not specified as acute or chronic: J40

## 2014-09-17 MED ORDER — ALBUTEROL SULFATE (2.5 MG/3ML) 0.083% IN NEBU
5.0000 mg | INHALATION_SOLUTION | Freq: Once | RESPIRATORY_TRACT | Status: AC
  Administered 2014-09-17: 5 mg via RESPIRATORY_TRACT
  Filled 2014-09-17: qty 6

## 2014-09-17 MED ORDER — HYDROCODONE-HOMATROPINE 5-1.5 MG/5ML PO SYRP: 5.0000 mL | ORAL_SOLUTION | Freq: Four times a day (QID) | ORAL | Status: DC | PRN

## 2014-09-17 MED ORDER — ALBUTEROL SULFATE HFA 108 (90 BASE) MCG/ACT IN AERS: 1.0000 | INHALATION_SPRAY | Freq: Four times a day (QID) | RESPIRATORY_TRACT | Status: DC | PRN

## 2014-09-17 MED ORDER — ACETAMINOPHEN 500 MG PO TABS
500.0000 mg | ORAL_TABLET | Freq: Once | ORAL | Status: AC
  Administered 2014-09-17: 500 mg via ORAL
  Filled 2014-09-17: qty 1

## 2014-09-17 NOTE — ED Notes (Signed)
PA at bedside.

## 2014-09-17 NOTE — Discharge Instructions (Signed)

## 2014-09-17 NOTE — ED Provider Notes (Signed)
CSN: [id number]     Arrival date & time 09/17/14  1129 History   First MD Initiated Contact with Patient 09/17/14 1221     Chief Complaint  Patient presents with  . Cough   Dana Giles is a 29 y.o. female with a history of bronchitis who presents to the emergency department complaining of productive cough for the past 3 days associated with general body aches and cold sweats. The patient reports she has a history of bronchitis and feels like she has bronchitis again. The patient's last had bronchitis 2 years ago. The patient reports she's been using an albuterol inhaler at home with some relief. The patient reports trying Mucinex with minimal relief. The patient reports having episodes of posttussive emesis today. Patient denies nausea or abdominal pain. The patient denies fevers, shortness of breath, abdominal pain, diarrhea, hematemesis, hematochezia, dysuria, rashes or sick contacts.  (Consider location/radiation/quality/duration/timing/severity/associated sxs/prior Treatment) HPI  Past Medical History  Diagnosis Date  . Anxiety attack   . Panic attacks   . Bronchitis    Past Surgical History  Procedure Laterality Date  . Breast surgery     No family history on file. History  Substance Use Topics  . Smoking status: Former Smoker -- 0.00 packs/day  . Smokeless tobacco: Not on file  . Alcohol Use: No   OB History    No data available     Review of Systems  Constitutional: Negative for fever and chills.  HENT: Positive for rhinorrhea and sore throat. Negative for congestion, ear pain, facial swelling and trouble swallowing.   Eyes: Negative for pain and visual disturbance.  Respiratory: Positive for cough. Negative for shortness of breath and wheezing.   Cardiovascular: Negative for chest pain and palpitations.  Gastrointestinal: Positive for vomiting. Negative for nausea, abdominal pain and diarrhea.  Genitourinary: Negative for dysuria.  Musculoskeletal: Negative for  back pain and neck pain.  Skin: Negative for rash.  Neurological: Negative for dizziness, weakness, light-headedness, numbness and headaches.      Allergies  Review of patient's allergies indicates no known allergies.  Home Medications   Prior to Admission medications   Medication Sig Start Date End Date Taking? Authorizing Provider  ClonazePAM (KLONOPIN PO) Take by mouth.   Yes Historical Provider, MD  albuterol (PROVENTIL HFA;VENTOLIN HFA) 108 (90 BASE) MCG/ACT inhaler Inhale 1-2 puffs into the lungs every 6 (six) hours as needed for wheezing or shortness of breath. 09/17/14   Einar Gip Fredrico Beedle, PA-C  HYDROcodone-homatropine (HYCODAN) 5-1.5 MG/5ML syrup Take 5 mLs by mouth every 6 (six) hours as needed for cough. 09/17/14   Einar Gip Lc Joynt, PA-C  sertraline (ZOLOFT) 25 MG tablet Take 25 mg by mouth daily.    Historical Provider, MD   BP 121/70 mmHg  Pulse 104  Temp(Src) 98.3 F (36.8 C) (Oral)  Resp 22  Ht  (1.626 m)  Wt 175 lb (79.379 kg)  BMI 30.02 kg/m2  SpO2 100%  LMP 08/25/2014 Physical Exam  Constitutional: She appears well-developed and well-nourished. No distress.  HENT:  Head: Normocephalic and atraumatic.  Right Ear: External ear normal.  Left Ear: External ear normal.  Nose: Nose normal.  Mouth/Throat: Oropharynx is clear and moist. No oropharyngeal exudate.  Patient's bilateral tympanic membranes are pearly-gray without erythema or loss of landmarks. There is no tonsillar hypertrophy or exudates noted.  Eyes: Conjunctivae are normal. Pupils are equal, round, and reactive to light. Right eye exhibits no discharge. Left eye exhibits no discharge.  Neck: Normal  range of motion. Neck supple.  Cardiovascular: Normal rate, regular rhythm, normal heart sounds and intact distal pulses.  Exam reveals no gallop and no friction rub.   No murmur heard. HR is 92. Bilateral radial pulses are intact.  Pulmonary/Chest: Effort normal and breath sounds normal. No  respiratory distress. She has no wheezes. She has no rales.  Respiration rate is 24. Lungs are clear to auscultation bilaterally. No wheezes or rhonchi noted. Oxygen saturation is between 94-100% on room air during my exam.   Abdominal: Soft. Bowel sounds are normal. She exhibits no distension and no mass. There is no tenderness. There is no rebound and no guarding.  Musculoskeletal: She exhibits no edema.  Lymphadenopathy:    She has no cervical adenopathy.  Neurological: She is alert. Coordination normal.  Skin: Skin is warm and dry. No rash noted. She is not diaphoretic. No erythema. No pallor.  Psychiatric: She has a normal mood and affect. Her behavior is normal.  Nursing note and vitals reviewed.   ED Course  Procedures (including critical care time) Labs Review Labs Reviewed - No data to display  Imaging Review Dg Chest 2 View  09/17/2014   CLINICAL DATA:  Cough, chills, anxiety  EXAM: CHEST  2 VIEW  COMPARISON:  09/17/2006  FINDINGS: Cardiomediastinal silhouette is stable. No acute infiltrate or pleural effusion. No pulmonary edema. Bony thorax is unremarkable.  IMPRESSION: No active cardiopulmonary disease.   Electronically Signed   By: Natasha MeadLiviu  Pop M.D.   On: 09/17/2014 12:35     EKG Interpretation None      Filed Vitals:   09/17/14 1137 09/17/14 1149 09/17/14 1330  BP: 131/95  121/70  Pulse: 89  104  Temp: 99.2 F (37.3 C)  98.3 F (36.8 C)  TempSrc: Oral    Resp: 32  22  Height: 5\' 4"  (1.626 m)    Weight: 175 lb (79.379 kg)    SpO2: 100% 100% 100%     MDM   Meds given in ED:  Medications  albuterol (PROVENTIL) (2.5 MG/3ML) 0.083% nebulizer solution 5 mg (5 mg Nebulization Given 09/17/14 1148)  acetaminophen (TYLENOL) tablet 500 mg (500 mg Oral Given 09/17/14 1331)    Discharge Medication List as of 09/17/2014  1:10 PM    START taking these medications   Details  albuterol (PROVENTIL HFA;VENTOLIN HFA) 108 (90 BASE) MCG/ACT inhaler Inhale 1-2 puffs into the  lungs every 6 (six) hours as needed for wheezing or shortness of breath., Starting 09/17/2014, Until Discontinued, Print    HYDROcodone-homatropine (HYCODAN) 5-1.5 MG/5ML syrup Take 5 mLs by mouth every 6 (six) hours as needed for cough., Starting 09/17/2014, Until Discontinued, Print        Final diagnoses:  Productive cough  Bronchitis   Visit 29 year old female with a history of bronchitis who presents to the emergency department complaining of a productive cough for the past 3 days. The patient reports this feels like her previous bronchitis. The patient reports some posttussive emesis without nausea today. The patient received an albuterol treatment in the ED with some relief. Patient denies fevers or chills. Patient denies shortness of breath or wheezing. The patient is afebrile and nontoxic-appearing. Patient's lungs are clear to auscultation bilaterally. Patient's chest x-rays unremarkable. We'll treat this patient for bronchitis and started this patient on Hycodan and given a prescription for albuterol inhaler. Education provided on systematic treatment of bronchitis. I advised the patient to follow-up with their primary care provider this week. I advised the patient to  return to the emergency department with new or worsening symptoms or new concerns. The patient verbalized understanding and agreement with plan.   This patient was discussed with Dr. Anitra Lauth who agrees with assessment and plan.    Lawana Chambers, PA-C 09/17/14 1759  Gwyneth Sprout, MD 09/18/14 [phone]

## 2014-09-17 NOTE — ED Notes (Signed)
C/o prod cough x 2-3 days

## 2014-10-10 ENCOUNTER — Emergency Department (HOSPITAL_BASED_OUTPATIENT_CLINIC_OR_DEPARTMENT_OTHER): Payer: PRIVATE HEALTH INSURANCE

## 2014-10-10 ENCOUNTER — Encounter (HOSPITAL_BASED_OUTPATIENT_CLINIC_OR_DEPARTMENT_OTHER): Payer: Self-pay | Admitting: *Deleted

## 2014-10-10 ENCOUNTER — Emergency Department (HOSPITAL_BASED_OUTPATIENT_CLINIC_OR_DEPARTMENT_OTHER)
Admission: EM | Admit: 2014-10-10 | Discharge: 2014-10-10 | Disposition: A | Discharge status: Home / Self Care | Payer: PRIVATE HEALTH INSURANCE | Attending: Emergency Medicine | Admitting: Emergency Medicine

## 2014-10-10 DIAGNOSIS — Z3202 Encounter for pregnancy test, result negative: Secondary | ICD-10-CM | POA: Insufficient documentation

## 2014-10-10 DIAGNOSIS — S39012A Strain of muscle, fascia and tendon of lower back, initial encounter: Secondary | ICD-10-CM | POA: Insufficient documentation

## 2014-10-10 DIAGNOSIS — F410 Panic disorder [episodic paroxysmal anxiety] without agoraphobia: Secondary | ICD-10-CM | POA: Insufficient documentation

## 2014-10-10 DIAGNOSIS — W108XXA Fall (on) (from) other stairs and steps, initial encounter: Secondary | ICD-10-CM | POA: Insufficient documentation

## 2014-10-10 DIAGNOSIS — Y998 Other external cause status: Secondary | ICD-10-CM | POA: Insufficient documentation

## 2014-10-10 DIAGNOSIS — Y9289 Other specified places as the place of occurrence of the external cause: Secondary | ICD-10-CM | POA: Insufficient documentation

## 2014-10-10 DIAGNOSIS — Y9389 Activity, other specified: Secondary | ICD-10-CM | POA: Insufficient documentation

## 2014-10-10 DIAGNOSIS — T148XXA Other injury of unspecified body region, initial encounter: Secondary | ICD-10-CM

## 2014-10-10 DIAGNOSIS — Z79899 Other long term (current) drug therapy: Secondary | ICD-10-CM | POA: Insufficient documentation

## 2014-10-10 DIAGNOSIS — Z87891 Personal history of nicotine dependence: Secondary | ICD-10-CM | POA: Insufficient documentation

## 2014-10-10 LAB — PREGNANCY, URINE: PREG TEST UR: NEGATIVE

## 2014-10-10 MED ORDER — HYDROCODONE-ACETAMINOPHEN 5-325 MG PO TABS: 1.0000 | ORAL_TABLET | ORAL | Status: DC | PRN

## 2014-10-10 MED ORDER — CYCLOBENZAPRINE HCL 10 MG PO TABS
5.0000 mg | ORAL_TABLET | Freq: Once | ORAL | Status: AC
  Administered 2014-10-10: 10 mg via ORAL
  Filled 2014-10-10: qty 1

## 2014-10-10 MED ORDER — METHOCARBAMOL 500 MG PO TABS
500.0000 mg | ORAL_TABLET | Freq: Two times a day (BID) | ORAL | Status: DC
Start: 2014-10-10 — End: 2014-10-11

## 2014-10-10 NOTE — ED Notes (Signed)
Pt c/o fall with lower back pain x 30 mins

## 2014-10-10 NOTE — Discharge Instructions (Signed)

## 2014-10-10 NOTE — ED Provider Notes (Signed)
CSN: [id number]     Arrival date & time 10/10/14  1430 History   First MD Initiated Contact with Patient 10/10/14 1435     Chief Complaint  Patient presents with  . Fall     (Consider location/radiation/quality/duration/timing/severity/associated sxs/prior Treatment) HPI Comments: Larey Seat down steps because of wearing socks. No loc.c/o lower and mid back pain. No numbness or weakness  Patient is a 29 y.o. female presenting with fall. The history is provided by the patient. No language interpreter was used.  Fall This is a new problem. The current episode started today. The problem occurs constantly. The problem has been unchanged. Pertinent negatives include no fever, numbness, visual change or weakness. She has tried nothing for the symptoms.    Past Medical History  Diagnosis Date  . Anxiety attack   . Panic attacks   . Bronchitis    Past Surgical History  Procedure Laterality Date  . Breast surgery     History reviewed. No pertinent family history. History  Substance Use Topics  . Smoking status: Former Smoker -- 0.00 packs/day  . Smokeless tobacco: Not on file  . Alcohol Use: No   OB History    No data available     Review of Systems  Constitutional: Negative for fever.  Neurological: Negative for weakness and numbness.  All other systems reviewed and are negative.     Allergies  Review of patient's allergies indicates no known allergies.  Home Medications   Prior to Admission medications   Medication Sig Start Date End Date Taking? Authorizing Provider  albuterol (PROVENTIL HFA;VENTOLIN HFA) 108 (90 BASE) MCG/ACT inhaler Inhale 1-2 puffs into the lungs every 6 (six) hours as needed for wheezing or shortness of breath. 09/17/14   Einar Gip Dansie, PA-C  ClonazePAM (KLONOPIN PO) Take by mouth.    Historical Provider, MD  HYDROcodone-homatropine (HYCODAN) 5-1.5 MG/5ML syrup Take 5 mLs by mouth every 6 (six) hours as needed for cough. 09/17/14   Einar Gip  Dansie, PA-C  sertraline (ZOLOFT) 25 MG tablet Take 25 mg by mouth daily.    Historical Provider, MD   BP 127/87 mmHg  Pulse 112  Temp(Src) 97.8 F (36.6 C)  Resp 18  Ht  (1.626 m)  Wt 170 lb (77.111 kg)  BMI 29.17 kg/m2  SpO2 100%  LMP 08/25/2014 Physical Exam  Constitutional: She is oriented to person, place, and time. She appears well-developed and well-nourished.  HENT:  Head: Normocephalic and atraumatic.  Cardiovascular: Normal rate and regular rhythm.   Pulmonary/Chest: Effort normal and breath sounds normal.  Abdominal: Soft. Bowel sounds are normal.  Musculoskeletal: Normal range of motion.       Cervical back: Normal.       Thoracic back: She exhibits bony tenderness.       Lumbar back: She exhibits bony tenderness.  Neurological: She is alert and oriented to person, place, and time. She exhibits normal muscle tone. Coordination normal.  Skin: Skin is warm and dry.  Psychiatric: She has a normal mood and affect.  Nursing note and vitals reviewed.   ED Course  Procedures (including critical care time) Labs Review Labs Reviewed - No data to display  Imaging Review Dg Thoracic Spine 2 View  10/10/2014   CLINICAL DATA:  29 year old female who fell down steps. Thoracic back pain radiating to the left lower extremity. Initial encounter.  EXAM: THORACIC SPINE - 2 VIEW  COMPARISON:  Lumbar series from today reported separately. Chest radiographs 09/17/2014.  FINDINGS: Hypoplastic  ribs at T12. Bone mineralization is within normal limits. Cervicothoracic junction alignment is within normal limits. Thoracic vertebral height and alignment Stable and within normal limits. Posterior ribs appear grossly intact. Stable thoracic visceral contours.  IMPRESSION: No acute fracture or listhesis identified in the thoracic spine.   Electronically Signed   By: Augusto GambleLee  Hall M.D.   On: 10/10/2014 15:27   Dg Lumbar Spine Complete  10/10/2014   CLINICAL DATA:  29 year old female who fell down  steps. Lumbar back pain radiating to the left lower extremity. Initial encounter.  EXAM: LUMBAR SPINE - COMPLETE 4+ VIEW  COMPARISON:  CT Abdomen and Pelvis 07/02/2011. Portable chest radiographs 09/17/2006.  FINDINGS: Twelve pairs of ribs demonstrated in 2008. Full size ribs at T11, smaller ribs at T12. Subsequently, partially sacralized L5 level with L5-S1 assimilation joints. Stable vertebral height and alignment. Stable disc spaces. No pars fracture. sacral ala and SI joints appear within normal limits. Visible lower thoracic levels appear intact.  IMPRESSION: 1.  No acute fracture or listhesis identified in the lumbar spine. 2. Transitional anatomy with sacralized L5 level.   Electronically Signed   By: Augusto GambleLee  Hall M.D.   On: 10/10/2014 15:26     EKG Interpretation None      MDM   Final diagnoses:  Muscle strain    Pt is neurologically intact. No acute injury noted. Will treat with hydrocodone and robaxin. Pt given follow up with Dr. Vivi BarrackHudnall    Andry Bogden, NP 10/10/14 1534  Elwin MochaBlair Walden, MD 10/10/14 [phone]

## 2014-10-11 ENCOUNTER — Ambulatory Visit (INDEPENDENT_AMBULATORY_CARE_PROVIDER_SITE_OTHER): Payer: PRIVATE HEALTH INSURANCE | Admitting: Family Medicine

## 2014-10-11 ENCOUNTER — Encounter: Payer: Self-pay | Admitting: Family Medicine

## 2014-10-11 VITALS — BP 118/92 | HR 139 | Ht 64.0 in | Wt 170.0 lb

## 2014-10-11 DIAGNOSIS — G8929 Other chronic pain: Secondary | ICD-10-CM

## 2014-10-11 DIAGNOSIS — M549 Dorsalgia, unspecified: Secondary | ICD-10-CM

## 2014-10-11 MED ORDER — OXYCODONE-ACETAMINOPHEN 5-325 MG PO TABS
1.0000 | ORAL_TABLET | Freq: Four times a day (QID) | ORAL | Status: DC | PRN
Start: 2014-10-11 — End: 2014-10-25

## 2014-10-11 MED ORDER — METAXALONE 800 MG PO TABS: 800.0000 mg | ORAL_TABLET | Freq: Three times a day (TID) | ORAL | Status: DC | PRN

## 2014-10-11 MED ORDER — METHOCARBAMOL 500 MG PO TABS: 500.0000 mg | ORAL_TABLET | Freq: Three times a day (TID) | ORAL | Status: DC | PRN

## 2014-10-11 MED ORDER — PREDNISONE (PAK) 10 MG PO TABS: ORAL_TABLET | ORAL | Status: DC

## 2014-10-11 NOTE — Patient Instructions (Signed)
You have severe lumbar and thoracic strains along with contusions. A prednisone dose pack is the best option for immediate relief and may be prescribed. Day after finishing prednisone start voltaren 75mg  twice a day with food for pain and inflammation. Percocet as needed for severe pain (no driving on this medicine). Skelaxin as needed for muscle spasms (no driving on this medicine if it makes you sleepy). Stay as active as possible. Physical therapy has been shown to be helpful as well but not when you're in this much pain - call me as early as a week from now if you're feeling better and we can order this. Strengthening of low back muscles, abdominal musculature are key for long term pain relief. If not improving, will consider further imaging (MRI). Follow up with me in 2 weeks.

## 2014-10-15 ENCOUNTER — Encounter: Payer: Self-pay | Admitting: Family Medicine

## 2014-10-15 NOTE — Assessment & Plan Note (Signed)
consistent with lumbar and thoracic strains.  Radiographs of both negative.  Her description of numbness entire left side is non-anatomic.  She does have chronic back pain as a problem from outside facility though she reports this is cervical.  Will start with prednisone, transition to voltaren.  Percocet and skelaxin as needed.  Will not refill percocet.  Call in a week to let me know how she's doing - add physical therapy if improving, MRI if not improving.  F/u in 2 weeks.

## 2014-10-15 NOTE — Progress Notes (Signed)
PCP: Default, Provider, MD  Subjective:   HPI: Patient is a 29 y.o. female here for back pain.  Patient reports she was walking down steps on 2/3 when her feet slid out from under her and she landed hard on mid-upper back with shoulders striking the bottom step. States entire left side has been tingling (arm and leg). Taking norco, robaxin from ED. No bowel/bladder dysfunction. Pain primarily in mid and low back.  Past Medical History  Diagnosis Date  . Anxiety attack   . Panic attacks   . Bronchitis     Current Outpatient Prescriptions on File Prior to Visit  Medication Sig Dispense Refill  . albuterol (PROVENTIL HFA;VENTOLIN HFA) 108 (90 BASE) MCG/ACT inhaler Inhale 1-2 puffs into the lungs every 6 (six) hours as needed for wheezing or shortness of breath. 1 Inhaler 0  . ClonazePAM (KLONOPIN PO) Take by mouth.    . fluticasone (FLONASE) 50 MCG/ACT nasal spray Place 1 spray into both nostrils as needed for allergies or rhinitis.    Marland Kitchen. sertraline (ZOLOFT) 25 MG tablet Take 25 mg by mouth daily.     No current facility-administered medications on file prior to visit.    Past Surgical History  Procedure Laterality Date  . Breast surgery    . Pilonidal cyst excision      Allergies  Allergen Reactions  . Hepatitis B Virus Vaccine Other (See Comments)    Passed out several hours after takiing  . Flexeril [Cyclobenzaprine] Anxiety    nightmares    History   Social History  . Marital Status: Single    Spouse Name: N/A    Number of Children: N/A  . Years of Education: N/A   Occupational History  . Not on file.   Social History Main Topics  . Smoking status: Former Smoker -- 0.00 packs/day  . Smokeless tobacco: Not on file  . Alcohol Use: No  . Drug Use: No  . Sexual Activity: Not on file   Other Topics Concern  . Not on file   Social History Narrative    No family history on file.  BP 118/92 mmHg  Pulse 139  Ht 5\' 4"  (1.626 m)  Wt 170 lb (77.111 kg)   BMI 29.17 kg/m2  LMP 08/25/2014  Review of Systems: See HPI above.    Objective:  Physical Exam:  Gen: NAD  Back: No gross deformity, scoliosis.  No palpable stepoffs. TTP diffusely mid-thoracic to low lumbar spine bilaterally.   Unable to flex, extend, rotate much due to severity of pain. Strength LEs 5/5 all muscle groups.   2+ MSRs in patellar and achilles tendons, equal bilaterally. Negative SLRs. Sensation intact to light touch bilaterally. Negative logroll bilateral hips    Assessment & Plan:  1. Back pain - consistent with lumbar and thoracic strains.  Radiographs of both negative.  Her description of numbness entire left side is non-anatomic.  She does have chronic back pain as a problem from outside facility though she reports this is cervical.  Will start with prednisone, transition to voltaren.  Percocet and skelaxin as needed.  Will not refill percocet.  Call in a week to let me know how she's doing - add physical therapy if improving, MRI if not improving.  F/u in 2 weeks.

## 2014-10-19 ENCOUNTER — Telehealth: Payer: Self-pay | Admitting: Family Medicine

## 2014-10-23 NOTE — Telephone Encounter (Signed)
Percocet and oxycodone are the same medication.  We cannot provide any other narcotic pain medication.  If she can tolerate oxycodone she should tolerate the percocet we prescribed.  She should also take the prednisone and muscle relaxant we prescribed.

## 2014-10-23 NOTE — Telephone Encounter (Signed)
Unable to contact patient. Left message for callback.

## 2014-10-25 ENCOUNTER — Ambulatory Visit (INDEPENDENT_AMBULATORY_CARE_PROVIDER_SITE_OTHER): Payer: Self-pay | Admitting: Family Medicine

## 2014-10-25 ENCOUNTER — Encounter: Payer: Self-pay | Admitting: Family Medicine

## 2014-10-25 ENCOUNTER — Telehealth: Payer: Self-pay | Admitting: *Deleted

## 2014-10-25 VITALS — BP 136/88 | HR 105 | Ht 64.0 in | Wt 170.0 lb

## 2014-10-25 DIAGNOSIS — G8929 Other chronic pain: Secondary | ICD-10-CM

## 2014-10-25 DIAGNOSIS — M549 Dorsalgia, unspecified: Secondary | ICD-10-CM

## 2014-10-25 MED ORDER — HYDROCODONE-ACETAMINOPHEN 5-325 MG PO TABS: 1.0000 | ORAL_TABLET | Freq: Four times a day (QID) | ORAL | Status: DC | PRN

## 2014-10-25 NOTE — Patient Instructions (Signed)
We will go ahead with an MRI to assess for disc herniation pinching a nerve on the left side. In meantime take aleve 2 tabs twice a day with food OR ibuprofen 600mg  three times a day with food. norco as needed for severe pain - no driving on this medicine. I will call you the business day following this to go over results.

## 2014-10-25 NOTE — Assessment & Plan Note (Signed)
Continues to have severe pain with radiation now into left leg only associated with numbness.  Radiographs negative.  S/p prednisone dose pack.  Norco, skelaxin as needed.  Will go ahead with MRI to assess for lumbar disc herniation.

## 2014-10-25 NOTE — Progress Notes (Signed)
PCP: Default, Provider, MD  Subjective:   HPI: Patient is a 29 y.o. female here for back pain.  2/4: Patient reports she was walking down steps on 2/3 when her feet slid out from under her and she landed hard on mid-upper back with shoulders striking the bottom step. States entire left side has been tingling (arm and leg). Taking norco, robaxin from ED. No bowel/bladder dysfunction. Pain primarily in mid and low back.  2/18: Patient reports she continues to have 8/10 level of pain in low back radiating into left leg to about the knee. Associated with numbness. Difficulty bending, sleeping. No bowel/bladder dysfunction. Taking percocet, skelaxin as needed. Prednisone seemed to help some.   Past Medical History  Diagnosis Date  . Anxiety attack   . Panic attacks   . Bronchitis     Current Outpatient Prescriptions on File Prior to Visit  Medication Sig Dispense Refill  . albuterol (PROVENTIL HFA;VENTOLIN HFA) 108 (90 BASE) MCG/ACT inhaler Inhale 1-2 puffs into the lungs every 6 (six) hours as needed for wheezing or shortness of breath. 1 Inhaler 0  . ClonazePAM (KLONOPIN PO) Take by mouth.    . fluticasone (FLONASE) 50 MCG/ACT nasal spray Place 1 spray into both nostrils as needed for allergies or rhinitis.    . methocarbamol (ROBAXIN) 500 MG tablet Take 1 tablet (500 mg total) by mouth every 8 (eight) hours as needed for muscle spasms. 90 tablet 1  . predniSONE (STERAPRED UNI-PAK) 10 MG tablet 6 tabs po day 1, 5 tabs po day 2, 4 tabs po day 3, 3 tabs po day 4, 2 tabs po day 5, 1 tab po day 6 21 tablet 0  . sertraline (ZOLOFT) 25 MG tablet Take 25 mg by mouth daily.     No current facility-administered medications on file prior to visit.    Past Surgical History  Procedure Laterality Date  . Breast surgery    . Pilonidal cyst excision      Allergies  Allergen Reactions  . Hepatitis B Virus Vaccine Other (See Comments)    Passed out several hours after takiing  .  Flexeril [Cyclobenzaprine] Anxiety    nightmares    History   Social History  . Marital Status: Single    Spouse Name: N/A  . Number of Children: N/A  . Years of Education: N/A   Occupational History  . Not on file.   Social History Main Topics  . Smoking status: Former Smoker -- 0.00 packs/day  . Smokeless tobacco: Not on file  . Alcohol Use: No  . Drug Use: No  . Sexual Activity: Not on file   Other Topics Concern  . Not on file   Social History Narrative    No family history on file.  BP 136/88 mmHg  Pulse 105  Ht 5\' 4"  (1.626 m)  Wt 170 lb (77.111 kg)  BMI 29.17 kg/m2  Review of Systems: See HPI above.    Objective:  Physical Exam:  Gen: NAD  Back: No gross deformity, scoliosis.  No palpable stepoffs. TTP diffusely mid-thoracic to low lumbar spine left side.   Full flexion and extension now - both painful. Strength LEs 5/5 all muscle groups.   2+ MSRs in patellar and achilles tendons, equal bilaterally. Negative SLRs. Sensation intact to light touch bilaterally. Negative logroll bilateral hips    Assessment & Plan:  1. Back pain - Continues to have severe pain with radiation now into left leg only associated with numbness.  Radiographs  negative.  S/p prednisone dose pack.  Norco, skelaxin as needed.  Will go ahead with MRI to assess for lumbar disc herniation.

## 2014-10-25 NOTE — Telephone Encounter (Signed)
Was still unable to contact patient by phone,however, patient had appointment on 10-25-14 and told her that the medicines in question were the same. Told her that if she could tolerate oxycodone, then she should be able to take the percocet that was prescribed.

## 2014-12-31 ENCOUNTER — Encounter (HOSPITAL_BASED_OUTPATIENT_CLINIC_OR_DEPARTMENT_OTHER): Payer: Self-pay | Admitting: *Deleted

## 2014-12-31 ENCOUNTER — Emergency Department (HOSPITAL_BASED_OUTPATIENT_CLINIC_OR_DEPARTMENT_OTHER)
Admission: EM | Admit: 2014-12-31 | Discharge: 2014-12-31 | Discharge status: Against Medical Advice | Payer: Self-pay | Attending: Emergency Medicine | Admitting: Emergency Medicine

## 2014-12-31 DIAGNOSIS — H9201 Otalgia, right ear: Secondary | ICD-10-CM | POA: Insufficient documentation

## 2014-12-31 NOTE — ED Notes (Signed)
Pt not in waiting room

## 2014-12-31 NOTE — ED Notes (Signed)
Pain in her right ear since yesterday. Woke this am with swelling and unable to hear out of her ear.

## 2015-05-21 ENCOUNTER — Encounter (HOSPITAL_BASED_OUTPATIENT_CLINIC_OR_DEPARTMENT_OTHER): Payer: Self-pay | Admitting: *Deleted

## 2015-05-21 ENCOUNTER — Emergency Department (HOSPITAL_BASED_OUTPATIENT_CLINIC_OR_DEPARTMENT_OTHER)
Admission: EM | Admit: 2015-05-21 | Discharge: 2015-05-21 | Disposition: A | Discharge status: Home / Self Care | Payer: Self-pay | Attending: Emergency Medicine | Admitting: Emergency Medicine

## 2015-05-21 DIAGNOSIS — Z87891 Personal history of nicotine dependence: Secondary | ICD-10-CM | POA: Insufficient documentation

## 2015-05-21 DIAGNOSIS — Z79899 Other long term (current) drug therapy: Secondary | ICD-10-CM | POA: Insufficient documentation

## 2015-05-21 DIAGNOSIS — F410 Panic disorder [episodic paroxysmal anxiety] without agoraphobia: Secondary | ICD-10-CM | POA: Insufficient documentation

## 2015-05-21 DIAGNOSIS — R0682 Tachypnea, not elsewhere classified: Secondary | ICD-10-CM | POA: Insufficient documentation

## 2015-05-21 DIAGNOSIS — F430 Acute stress reaction: Secondary | ICD-10-CM

## 2015-05-21 DIAGNOSIS — Z8709 Personal history of other diseases of the respiratory system: Secondary | ICD-10-CM | POA: Insufficient documentation

## 2015-05-21 MED ORDER — LORAZEPAM 2 MG/ML IJ SOLN
2.0000 mg | Freq: Once | INTRAMUSCULAR | Status: AC
  Administered 2015-05-21: 2 mg via INTRAMUSCULAR
  Filled 2015-05-21: qty 1

## 2015-05-21 NOTE — ED Notes (Signed)
While at her psychiatrist office she started having chest pain while talking about recent stressful situations. Hyperventilating on arrival to triage. Drove herself here. She is crying and unable to control her emotions.

## 2015-05-21 NOTE — ED Notes (Signed)
Pt cont await her ride home. Pt requests to wait in waiting area, agrees to give her car keys to security officer, and she will notify them when her ride arrives to pick her up. Pt amb to waiting room, introduced to officer Thereasa Distance, keys given to him by pt., she will notify him when her ride arrives to pick her up.

## 2015-05-21 NOTE — ED Provider Notes (Addendum)
CSN: [id number]     Arrival date & time 05/21/15  1445 History   First MD Initiated Contact with Patient 05/21/15 1518     Chief Complaint  Patient presents with  . Chest Pain     (Consider location/radiation/quality/duration/timing/severity/associated sxs/prior Treatment) Patient is a 29 y.o. female presenting with chest pain. The history is provided by the patient.  Chest Pain Pain location:  Substernal area Pain quality: sharp and stabbing   Pain radiates to:  Does not radiate Pain radiates to the back: no   Pain severity:  Severe Onset quality:  Sudden Duration:  1 hour Timing:  Constant Progression:  Unchanged Chronicity:  New Context: stress   Context comment:  Started when at the psychiatrist talking about the last few months which is very painful for her b/c 3 people close to her have died.  she states she was talking bout it and suddenly couldn't breath and had chest pain Relieved by:  None tried Worsened by:  Deep breathing Ineffective treatments:  None tried Associated symptoms: anxiety and shortness of breath   Associated symptoms: no abdominal pain, no back pain, no cough, no headache and no lower extremity edema   Associated symptoms comment:  Feeling of impending doom.  States feels like her prior panic attacks but worse.  Normally takes a klonopin daily but ran out last week when at her grandma's funeral and has not had any in 1 week. Risk factors: no birth control, no coronary artery disease, no diabetes mellitus, no hypertension, no immobilization, not obese, not pregnant, no prior DVT/PE, no smoking and no surgery     Past Medical History  Diagnosis Date  . Anxiety attack   . Panic attacks   . Bronchitis    Past Surgical History  Procedure Laterality Date  . Breast surgery    . Pilonidal cyst excision     No family history on file. Social History  Substance Use Topics  . Smoking status: Former Smoker -- 0.00 packs/day  . Smokeless tobacco: None  .  Alcohol Use: No   OB History    No data available     Review of Systems  Respiratory: Positive for shortness of breath. Negative for cough.   Cardiovascular: Positive for chest pain.  Gastrointestinal: Negative for abdominal pain.  Musculoskeletal: Negative for back pain.  Neurological: Negative for headaches.  All other systems reviewed and are negative.     Allergies  Hepatitis b virus vaccine and Flexeril  Home Medications   Prior to Admission medications   Medication Sig Start Date End Date Taking? Authorizing Provider  albuterol (PROVENTIL HFA;VENTOLIN HFA) 108 (90 BASE) MCG/ACT inhaler Inhale 1-2 puffs into the lungs every 6 (six) hours as needed for wheezing or shortness of breath. 09/17/14   Everlene Farrier, PA-C  ClonazePAM (KLONOPIN PO) Take by mouth.    Historical Provider, MD  fluticasone (FLONASE) 50 MCG/ACT nasal spray Place 1 spray into both nostrils as needed for allergies or rhinitis.    Historical Provider, MD  HYDROcodone-acetaminophen (NORCO) 5-325 MG per tablet Take 1 tablet by mouth every 6 (six) hours as needed for moderate pain. 10/25/14   Lenda Kelp, MD  methocarbamol (ROBAXIN) 500 MG tablet Take 1 tablet (500 mg total) by mouth every 8 (eight) hours as needed for muscle spasms. 10/11/14   Lenda Kelp, MD  predniSONE (STERAPRED UNI-PAK) 10 MG tablet 6 tabs po day 1, 5 tabs po day 2, 4 tabs po day 3, 3 tabs po  day 4, 2 tabs po day 5, 1 tab po day 6 10/11/14   Lenda Kelp, MD  sertraline (ZOLOFT) 25 MG tablet Take 25 mg by mouth daily.    Historical Provider, MD   BP 116/89 mmHg  Pulse 97  Temp(Src) 98.1 F (36.7 C) (Oral)  Resp 20  Ht  (1.626 m)  Wt 157 lb (71.215 kg)  BMI 26.94 kg/m2  SpO2 100%  LMP 05/07/2015 Physical Exam  Constitutional: She is oriented to person, place, and time. She appears well-developed and well-nourished. She appears distressed.  Crying on exam and hyperventilating  HENT:  Head: Normocephalic and atraumatic.   Mouth/Throat: Oropharynx is clear and moist.  Eyes: Conjunctivae and EOM are normal. Pupils are equal, round, and reactive to light.  Neck: Normal range of motion. Neck supple.  Cardiovascular: Normal rate, regular rhythm and intact distal pulses.   No murmur heard. Pulmonary/Chest: Effort normal and breath sounds normal. Tachypnea noted. No respiratory distress. She has no wheezes. She has no rales. She exhibits no tenderness.  Abdominal: Soft. She exhibits no distension. There is no tenderness. There is no rebound and no guarding.  Musculoskeletal: Normal range of motion. She exhibits no edema or tenderness.  Neurological: She is alert and oriented to person, place, and time.  Skin: Skin is warm and dry. No rash noted. No erythema.  Psychiatric: Her behavior is normal. Her mood appears anxious. She expresses no homicidal and no suicidal ideation.  Nursing note and vitals reviewed.   ED Course  Procedures (including critical care time) Labs Review Labs Reviewed - No data to display  Imaging Review No results found. I have personally reviewed and evaluated these images and lab results as part of my medical decision-making.   EKG Interpretation   Date/Time:  Tuesday May 21 2015 14:55:27 EDT Ventricular Rate:  97 PR Interval:  144 QRS Duration: 68 QT Interval:  338 QTC Calculation: 429 R Axis:   52 Text Interpretation:  Normal sinus rhythm Normal ECG nonspecific t wave  abnormality improved Confirmed by [facility]  MD, TREY ([id number]) on 05/21/2015  2:56:43 PM      MDM   Final diagnoses:  Panic attack due to exceptional stress    Pt presents today with symptoms most suggestive of a panic attack.  She was at psychiatrist office and states she has had 3 people close to her die in the last 2 months and she was talking about it and she suddenly developed SOB, sharp pain in the chest and feeling the walls closing in .  Pt suffers from anxiety anyways and had run out of her  clonipin last week while at her grandmother's funeral.  She denies hx of cardiac disease and was not having any symptoms prior to psych appointment.  Pt has not had anything since sx started over an hour ago.  No drug or tobacco use.  No longer takes zoloft.  EKG wnl today.  Reassured pt and gave her some IM ativan.  Will re-evalutate however low suspicion for PE and PERC neg, low suspicion for MI or ACS.  Clear breath sounds bilaterally with low suspicion for PTX  4:23 PM Pt is starting to feel better.  Again no concerning hx for cardiac disease, PE or dissection.  Pt sx are from panic attack.  Denies SI.  Has close f/u with her psychiatrist.  Gwyneth Sprout, MD 05/21/15 1624  Gwyneth Sprout, MD 05/21/15 1625

## 2015-05-21 NOTE — ED Notes (Signed)
Patient wheeled to room 6 in a wheelchair. Patient moved to bed by standing and pivot patient given a gown and asked to undress and put the gown on open in the back.

## 2015-05-21 NOTE — Discharge Instructions (Signed)
Panic Attacks Panic attacks are sudden, short-livedsurges of severe anxiety, fear, or discomfort. They may occur for no reason when you are relaxed, when you are anxious, or when you are sleeping. Panic attacks may occur for a number of reasons:   Healthy people occasionally have panic attacks in extreme, life-threatening situations, such as war or natural disasters. Normal anxiety is a protective mechanism of the body that helps [place] react to danger (fight or flight response).  Panic attacks are often seen with anxiety disorders, such as panic disorder, social anxiety disorder, generalized anxiety disorder, and phobias. Anxiety disorders cause excessive or uncontrollable anxiety. They may interfere with your relationships or other life activities.  Panic attacks are sometimes seen with other mental illnesses, such as depression and posttraumatic stress disorder.  Certain medical conditions, prescription medicines, and drugs of abuse can cause panic attacks. SYMPTOMS  Panic attacks start suddenly, peak within 20 minutes, and are accompanied by four or more of the following symptoms:  Pounding heart or fast heart rate (palpitations).  Sweating.  Trembling or shaking.  Shortness of breath or feeling smothered.  Feeling choked.  Chest pain or discomfort.  Nausea or strange feeling in your stomach.  Dizziness, light-headedness, or feeling like you will faint.  Chills or hot flushes.  Numbness or tingling in your lips or hands and feet.  Feeling that things are not real or feeling that you are not yourself.  Fear of losing control or going crazy.  Fear of dying. Some of these symptoms can mimic serious medical conditions. For example, you may think you are having a heart attack. Although panic attacks can be very scary, they are not life threatening. DIAGNOSIS  Panic attacks are diagnosed through an assessment by your health care provider. Your health care provider will ask  questions about your symptoms, such as where and when they occurred. Your health care provider will also ask about your medical history and use of alcohol and drugs, including prescription medicines. Your health care provider may order blood tests or other studies to rule out a serious medical condition. Your health care provider may refer you to a mental health professional for further evaluation. TREATMENT   Most healthy people who have one or two panic attacks in an extreme, life-threatening situation will not require treatment.  The treatment for panic attacks associated with anxiety disorders or other mental illness typically involves counseling with a mental health professional, medicine, or a combination of both. Your health care provider will help determine what treatment is best for you.  Panic attacks due to physical illness usually go away with treatment of the illness. If prescription medicine is causing panic attacks, talk with your health care provider about stopping the medicine, decreasing the dose, or substituting another medicine.  Panic attacks due to alcohol or drug abuse go away with abstinence. Some adults need professional help in order to stop drinking or using drugs. HOME CARE INSTRUCTIONS   Take all medicines as directed by your health care provider.   Schedule and attend follow-up visits as directed by your health care provider. It is important to keep all your appointments. SEEK MEDICAL CARE IF:  You are not able to take your medicines as prescribed.  Your symptoms do not improve or get worse. SEEK IMMEDIATE MEDICAL CARE IF:   You experience panic attack symptoms that are different than your usual symptoms.  You have serious thoughts about hurting yourself or others.  You are taking medicine for panic attacks and  have a serious side effect. MAKE SURE YOU:  Understand these instructions.  Will watch your condition.  Will get help right away if you are not  doing well or get worse. Document Released: 08/24/2005 Document Revised: 08/29/2013 Document Reviewed: 04/07/2013 [facility] Patient Information 2015 [place], [place]. This information is not intended to replace advice given to you by your health care provider. Make sure you discuss any questions you have with your health care provider.  Stress and Stress Management Stress is a normal reaction to life events. It is what you feel when life demands more than you are used to or more than you can handle. Some stress can be useful. For example, the stress reaction can help you catch the last bus of the day, study for a test, or meet a deadline at work. But stress that occurs too often or for too long can cause problems. It can affect your emotional health and interfere with relationships and normal daily activities. Too much stress can weaken your immune system and increase your risk for physical illness. If you already have a medical problem, stress can make it worse. CAUSES  All sorts of life events may cause stress. An event that causes stress for one person may not be stressful for another person. Major life events commonly cause stress. These may be positive or negative. Examples include losing your job, moving into a new home, getting married, having a baby, or losing a loved one. Less obvious life events may also cause stress, especially if they occur day after day or in combination. Examples include working long hours, driving in traffic, caring for children, being in debt, or being in a difficult relationship. SIGNS AND SYMPTOMS Stress may cause emotional symptoms including, the following:  Anxiety. This is feeling worried, afraid, on edge, overwhelmed, or out of control.  Anger. This is feeling irritated or impatient.  Depression. This is feeling sad, down, helpless, or guilty.  Difficulty focusing, remembering, or making decisions. Stress may cause physical symptoms, including the following:    Aches and pains. These may affect your head, neck, back, stomach, or other areas of your body.  Tight muscles or clenched jaw.  Low energy or trouble sleeping. Stress may cause unhealthy behaviors, including the following:   Eating to feel better (overeating) or skipping meals.  Sleeping too little, too much, or both.  Working too much or putting off tasks (procrastination).  Smoking, drinking alcohol, or using drugs to feel better. DIAGNOSIS  Stress is diagnosed through an assessment by your health care provider. Your health care provider will ask questions about your symptoms and any stressful life events.Your health care provider will also ask about your medical history and may order blood tests or other tests. Certain medical conditions and medicine can cause physical symptoms similar to stress. Mental illness can cause emotional symptoms and unhealthy behaviors similar to stress. Your health care provider may refer you to a mental health professional for further evaluation.  TREATMENT  Stress management is the recommended treatment for stress.The goals of stress management are reducing stressful life events and coping with stress in healthy ways.  Techniques for reducing stressful life events include the following:  Stress identification. Self-monitor for stress and identify what causes stress for you. These skills may help you to avoid some stressful events.  Time management. Set your priorities, keep a calendar of events, and learn to say "no." These tools can help you avoid making too many commitments. Techniques for coping with stress  include the following:  Rethinking the problem. Try to think realistically about stressful events rather than ignoring them or overreacting. Try to find the positives in a stressful situation rather than focusing on the negatives.  Exercise. Physical exercise can release both physical and emotional tension. The key is to find a form of  exercise you enjoy and do it regularly.  Relaxation techniques. These relax the body and mind. Examples include yoga, meditation, tai chi, biofeedback, deep breathing, progressive muscle relaxation, listening to music, being out in nature, journaling, and other hobbies. Again, the key is to find one or more that you enjoy and can do regularly.  Healthy lifestyle. Eat a balanced diet, get plenty of sleep, and do not smoke. Avoid using alcohol or drugs to relax.  Strong support network. Spend time with family, friends, or other people you enjoy being around.Express your feelings and talk things over with someone you trust. Counseling or talktherapy with a mental health professional may be helpful if you are having difficulty managing stress on your own. Medicine is typically not recommended for the treatment of stress.Talk to your health care provider if you think you need medicine for symptoms of stress. HOME CARE INSTRUCTIONS  Keep all follow-up visits as directed by your health care provider.  Take all medicines as directed by your health care provider. SEEK MEDICAL CARE IF:  Your symptoms get worse or you start having new symptoms.  You feel overwhelmed by your problems and can no longer manage them on your own. SEEK IMMEDIATE MEDICAL CARE IF:  You feel like hurting yourself or someone else. Document Released: 02/17/2001 Document Revised: 01/08/2014 Document Reviewed: 04/18/2013 [facility] Patient Information 2015 [place], [place]. This information is not intended to replace advice given to you by your health care provider. Make sure you discuss any questions you have with your health care provider.

## 2015-08-12 ENCOUNTER — Emergency Department (HOSPITAL_BASED_OUTPATIENT_CLINIC_OR_DEPARTMENT_OTHER)
Admission: EM | Admit: 2015-08-12 | Discharge: 2015-08-12 | Disposition: A | Discharge status: Home / Self Care | Payer: Self-pay | Attending: Emergency Medicine | Admitting: Emergency Medicine

## 2015-08-12 ENCOUNTER — Encounter (HOSPITAL_BASED_OUTPATIENT_CLINIC_OR_DEPARTMENT_OTHER): Payer: Self-pay | Admitting: *Deleted

## 2015-08-12 ENCOUNTER — Emergency Department (HOSPITAL_BASED_OUTPATIENT_CLINIC_OR_DEPARTMENT_OTHER): Payer: Self-pay

## 2015-08-12 DIAGNOSIS — Z87891 Personal history of nicotine dependence: Secondary | ICD-10-CM | POA: Insufficient documentation

## 2015-08-12 DIAGNOSIS — F410 Panic disorder [episodic paroxysmal anxiety] without agoraphobia: Secondary | ICD-10-CM | POA: Insufficient documentation

## 2015-08-12 DIAGNOSIS — X501XXA Overexertion from prolonged static or awkward postures, initial encounter: Secondary | ICD-10-CM | POA: Insufficient documentation

## 2015-08-12 DIAGNOSIS — Z8709 Personal history of other diseases of the respiratory system: Secondary | ICD-10-CM | POA: Insufficient documentation

## 2015-08-12 DIAGNOSIS — Y998 Other external cause status: Secondary | ICD-10-CM | POA: Insufficient documentation

## 2015-08-12 DIAGNOSIS — S93402A Sprain of unspecified ligament of left ankle, initial encounter: Secondary | ICD-10-CM | POA: Insufficient documentation

## 2015-08-12 DIAGNOSIS — S9002XA Contusion of left ankle, initial encounter: Secondary | ICD-10-CM | POA: Insufficient documentation

## 2015-08-12 DIAGNOSIS — Y9289 Other specified places as the place of occurrence of the external cause: Secondary | ICD-10-CM | POA: Insufficient documentation

## 2015-08-12 DIAGNOSIS — S80212A Abrasion, left knee, initial encounter: Secondary | ICD-10-CM | POA: Insufficient documentation

## 2015-08-12 DIAGNOSIS — Y9389 Activity, other specified: Secondary | ICD-10-CM | POA: Insufficient documentation

## 2015-08-12 MED ORDER — IBUPROFEN 800 MG PO TABS
800.0000 mg | ORAL_TABLET | Freq: Once | ORAL | Status: AC
  Administered 2015-08-12: 800 mg via ORAL
  Filled 2015-08-12: qty 1

## 2015-08-12 MED ORDER — IBUPROFEN 800 MG PO TABS: 800.0000 mg | ORAL_TABLET | Freq: Three times a day (TID) | ORAL | Status: DC | PRN

## 2015-08-12 NOTE — Discharge Instructions (Signed)
Acute Ankle Sprain With Phase I Rehab An acute ankle sprain is a partial or complete tear in one or more of the ligaments of the ankle due to traumatic injury. The severity of the injury depends on both the number of ligaments sprained and the grade of sprain. There are 3 grades of sprains.   A grade 1 sprain is a mild sprain. There is a slight pull without obvious tearing. There is no loss of strength, and the muscle and ligament are the correct length.  A grade 2 sprain is a moderate sprain. There is tearing of fibers within the substance of the ligament where it connects two bones or two cartilages. The length of the ligament is increased, and there is usually decreased strength.  A grade 3 sprain is a complete rupture of the ligament and is uncommon. In addition to the grade of sprain, there are three types of ankle sprains.  Lateral ankle sprains: This is a sprain of one or more of the three ligaments on the outer side (lateral) of the ankle. These are the most common sprains. Medial ankle sprains: There is one large triangular ligament of the inner side (medial) of the ankle that is susceptible to injury. Medial ankle sprains are less common. Syndesmosis, "high ankle," sprains: The syndesmosis is the ligament that connects the two bones of the lower leg. Syndesmosis sprains usually only occur with very severe ankle sprains. SYMPTOMS  Pain, tenderness, and swelling in the ankle, starting at the side of injury that may progress to the whole ankle and foot with time.  "Pop" or tearing sensation at the time of injury.  Bruising that may spread to the heel.  Impaired ability to walk soon after injury. CAUSES   Acute ankle sprains are caused by trauma placed on the ankle that temporarily forces or pries the anklebone (talus) out of its normal socket.  Stretching or tearing of the ligaments that normally hold the joint in place (usually due to a twisting injury). RISK INCREASES  WITH:  Previous ankle sprain.  Sports in which the foot may land awkwardly (i.e., basketball, volleyball, or soccer) or walking or running on uneven or rough surfaces.  Shoes with inadequate support to prevent sideways motion when stress occurs.  Poor strength and flexibility.  Poor balance skills.  Contact sports. PREVENTION   Warm up and stretch properly before activity.  Maintain physical fitness:  Ankle and leg flexibility, muscle strength, and endurance.  Cardiovascular fitness.  Balance training activities.  Use proper technique and have a coach correct improper technique.  Taping, protective strapping, bracing, or high-top tennis shoes may help prevent injury. Initially, tape is best; however, it loses most of its support function within 10 to 15 minutes.  Wear proper-fitted protective shoes (High-top shoes with taping or bracing is more effective than either alone).  Provide the ankle with support during sports and practice activities for 12 months following injury. PROGNOSIS   If treated properly, ankle sprains can be expected to recover completely; however, the length of recovery depends on the degree of injury.  A grade 1 sprain usually heals enough in 5 to 7 days to allow modified activity and requires an average of 6 weeks to heal completely.  A grade 2 sprain requires 6 to 10 weeks to heal completely.  A grade 3 sprain requires 12 to 16 weeks to heal.  A syndesmosis sprain often takes more than 3 months to heal. RELATED COMPLICATIONS   Frequent recurrence of symptoms may   result in a chronic problem. Appropriately addressing the problem the first time decreases the frequency of recurrence and optimizes healing time. Severity of the initial sprain does not predict the likelihood of later instability. °· Injury to other structures (bone, cartilage, or tendon). °· A chronically unstable or arthritic ankle joint is a possibility with repeated  sprains. °TREATMENT °Treatment initially involves the use of ice, medication, and compression bandages to help reduce pain and inflammation. Ankle sprains are usually immobilized in a walking cast or boot to allow for healing. Crutches may be recommended to reduce pressure on the injury. After immobilization, strengthening and stretching exercises may be necessary to regain strength and a full range of motion. Surgery is rarely needed to treat ankle sprains. °MEDICATION  °· Nonsteroidal anti-inflammatory medications, such as aspirin and ibuprofen (do not take for the first 3 days after injury or within 7 days before surgery), or other minor pain relievers, such as acetaminophen, are often recommended. Take these as directed by your caregiver. Contact your caregiver immediately if any bleeding, stomach upset, or signs of an allergic reaction occur from these medications. °· Ointments applied to the skin may be helpful. °· Pain relievers may be prescribed as necessary by your caregiver. Do not take prescription pain medication for longer than 4 to 7 days. Use only as directed and only as much as you need. °HEAT AND COLD °· Cold treatment (icing) is used to relieve pain and reduce inflammation for acute and chronic cases. Cold should be applied for 10 to 15 minutes every 2 to 3 hours for inflammation and pain and immediately after any activity that aggravates your symptoms. Use ice packs or an ice massage. °· Heat treatment may be used before performing stretching and strengthening activities prescribed by your caregiver. Use a heat pack or a warm soak. °SEEK IMMEDIATE MEDICAL CARE IF:  °· Pain, swelling, or bruising worsens despite treatment. °· You experience pain, numbness, discoloration, or coldness in the foot or toes. °· New, unexplained symptoms develop (drugs used in treatment may produce side effects.) °EXERCISES  °PHASE I EXERCISES °RANGE OF MOTION (ROM) AND STRETCHING EXERCISES - Ankle Sprain, Acute Phase I,  Weeks 1 to 2 °These exercises may help you when beginning to restore flexibility in your ankle. You will likely work on these exercises for the 1 to 2 weeks after your injury. Once your physician, physical therapist, or athletic trainer sees adequate progress, he or she will advance your exercises. While completing these exercises, remember:  °· Restoring tissue flexibility helps normal motion to return to the joints. This allows healthier, less painful movement and activity. °· An effective stretch should be held for at least 30 seconds. °· A stretch should never be painful. You should only feel a gentle lengthening or release in the stretched tissue. °RANGE OF MOTION - Dorsi/Plantar Flexion °· While sitting with your right / left knee straight, draw the top of your foot upwards by flexing your ankle. Then reverse the motion, pointing your toes downward. °· Hold each position for __________ seconds. °· After completing your first set of exercises, repeat this exercise with your knee bent. °Repeat __________ times. Complete this exercise __________ times per day.  °RANGE OF MOTION - Ankle Alphabet °· Imagine your right / left big toe is a pen. °· Keeping your hip and knee still, write out the entire alphabet with your "pen." Make the letters as large as you can without increasing any discomfort. °Repeat __________ times. Complete this exercise __________   times per day.  °STRENGTHENING EXERCISES - Ankle Sprain, Acute -Phase I, Weeks 1 to 2 °These exercises may help you when beginning to restore strength in your ankle. You will likely work on these exercises for 1 to 2 weeks after your injury. Once your physician, physical therapist, or athletic trainer sees adequate progress, he or she will advance your exercises. While completing these exercises, remember:  °· Muscles can gain both the endurance and the strength needed for everyday activities through controlled exercises. °· Complete these exercises as instructed by  your physician, physical therapist, or athletic trainer. Progress the resistance and repetitions only as guided. °· You may experience muscle soreness or fatigue, but the pain or discomfort you are trying to eliminate should never worsen during these exercises. If this pain does worsen, stop and make certain you are following the directions exactly. If the pain is still present after adjustments, discontinue the exercise until you can discuss the trouble with your clinician. °STRENGTH - Dorsiflexors °· Secure a rubber exercise band/tubing to a fixed object (i.e., table, pole) and loop the other end around your right / left foot. °· Sit on the floor facing the fixed object. The band/tubing should be slightly tense when your foot is relaxed. °· Slowly draw your foot back toward you using your ankle and toes. °· Hold this position for __________ seconds. Slowly release the tension in the band and return your foot to the starting position. °Repeat __________ times. Complete this exercise __________ times per day.  °STRENGTH - Plantar-flexors  °· Sit with your right / left leg extended. Holding onto both ends of a rubber exercise band/tubing, loop it around the ball of your foot. Keep a slight tension in the band. °· Slowly push your toes away from you, pointing them downward. °· Hold this position for __________ seconds. Return slowly, controlling the tension in the band/tubing. °Repeat __________ times. Complete this exercise __________ times per day.  °STRENGTH - Ankle Eversion °· Secure one end of a rubber exercise band/tubing to a fixed object (table, pole). Loop the other end around your foot just before your toes. °· Place your fists between your knees. This will focus your strengthening at your ankle. °· Drawing the band/tubing across your opposite foot, slowly, pull your little toe out and up. Make sure the band/tubing is positioned to resist the entire motion. °· Hold this position for __________ seconds. °Have  your muscles resist the band/tubing as it slowly pulls your foot back to the starting position.  °Repeat __________ times. Complete this exercise __________ times per day.  °STRENGTH - Ankle Inversion °· Secure one end of a rubber exercise band/tubing to a fixed object (table, pole). Loop the other end around your foot just before your toes. °· Place your fists between your knees. This will focus your strengthening at your ankle. °· Slowly, pull your big toe up and in, making sure the band/tubing is positioned to resist the entire motion. °· Hold this position for __________ seconds. °· Have your muscles resist the band/tubing as it slowly pulls your foot back to the starting position. °Repeat __________ times. Complete this exercises __________ times per day.  °STRENGTH - Towel Curls °· Sit in a chair positioned on a non-carpeted surface. °· Place your right / left foot on a towel, keeping your heel on the floor. °· Pull the towel toward your heel by only curling your toes. Keep your heel on the floor. °· If instructed by your physician, physical therapist,   or athletic trainer, add weight to the end of the towel. Repeat __________ times. Complete this exercise __________ times per day.   This information is not intended to replace advice given to you by your health care provider. Make sure you discuss any questions you have with your health care provider.   Document Released: 03/25/2005 Document Revised: 09/14/2014 Document Reviewed: 12/06/2008 Elsevier Interactive Patient Education 2016 [employer]   Cryotherapy Cryotherapy is when you put ice on your injury. Ice helps lessen pain and puffiness (swelling) after an injury. Ice works the best when you start using it in the first 24 to 48 hours after an injury. HOME CARE  Put a dry or damp towel between the ice pack and your skin.  You may press gently on the ice pack.  Leave the ice on for no more than 10 to 20 minutes at a time.  Check your  skin after 5 minutes to make sure your skin is okay.  Rest at least 20 minutes between ice pack uses.  Stop using ice when your skin loses feeling (numbness).  Do not use ice on someone who cannot tell you when it hurts. This includes small children and people with memory problems (dementia). GET HELP RIGHT AWAY IF:  You have white spots on your skin.  Your skin turns blue or pale.  Your skin feels waxy or hard.  Your puffiness gets worse. MAKE SURE YOU:   Understand these instructions.  Will watch your condition.  Will get help right away if you are not doing well or get worse.   This information is not intended to replace advice given to you by your health care provider. Make sure you discuss any questions you have with your health care provider.   Document Released: 02/10/2008 Document Revised: 11/16/2011 Document Reviewed: 04/16/2011 Elsevier Interactive Patient Education 2016 [employer]  Cryotherapy Cryotherapy is when you put ice on your injury. Ice helps lessen pain and puffiness (swelling) after an injury. Ice works the best when you start using it in the first 24 to 48 hours after an injury. HOME CARE  Put a dry or damp towel between the ice pack and your skin.  You may press gently on the ice pack.  Leave the ice on for no more than 10 to 20 minutes at a time.  Check your skin after 5 minutes to make sure your skin is okay.  Rest at least 20 minutes between ice pack uses.  Stop using ice when your skin loses feeling (numbness).  Do not use ice on someone who cannot tell you when it hurts. This includes small children and people with memory problems (dementia). GET HELP RIGHT AWAY IF:  You have white spots on your skin.  Your skin turns blue or pale.  Your skin feels waxy or hard.  Your puffiness gets worse. MAKE SURE YOU:   Understand these instructions.  Will watch your condition.  Will get help right away if you are not doing well or get  worse.   This information is not intended to replace advice given to you by your health care provider. Make sure you discuss any questions you have with your health care provider.   Document Released: 02/10/2008 Document Revised: 11/16/2011 Document Reviewed: 04/16/2011 Elsevier Interactive Patient Education [employer]

## 2015-08-12 NOTE — ED Notes (Signed)
Terri Scientist, physiologicalCharge RN Ice and Elevate Pt. L foot.

## 2015-08-12 NOTE — ED Notes (Signed)
Larey SeatFell while getting out of her car. Injury to her left foot. Abrasion to her knee.

## 2015-08-12 NOTE — ED Provider Notes (Signed)
CSN: [id number]     Arrival date & time 08/12/15  1343 History   First MD Initiated Contact with Patient 08/12/15 1355     Chief Complaint  Patient presents with  . Foot Injury     (Consider location/radiation/quality/duration/timing/severity/associated sxs/prior Treatment) HPI   29 year old female presents for evaluation of a fall.  PTA pt stepped off her SUV and twisted her L ankle, causing her to fell forward.  She suffered an abrasion to her L knee but does report moderate pain to her L ankle.  Pain is sharp, throbbing, worsening with movement.  Pain is moderate in intensity.  No specific treatment tried, no numbness or weakness.    Past Medical History  Diagnosis Date  . Anxiety attack   . Panic attacks   . Bronchitis    Past Surgical History  Procedure Laterality Date  . Breast surgery    . Pilonidal cyst excision     No family history on file. Social History  Substance Use Topics  . Smoking status: Former Smoker -- 0.00 packs/day  . Smokeless tobacco: None  . Alcohol Use: No   OB History    No data available     Review of Systems  Constitutional: Negative for fever.  Musculoskeletal: Positive for joint swelling and arthralgias.  Neurological: Negative for numbness.      Allergies  Hepatitis b virus vaccine and Flexeril  Home Medications   Prior to Admission medications   Medication Sig Start Date End Date Taking? Authorizing Provider  albuterol (PROVENTIL HFA;VENTOLIN HFA) 108 (90 BASE) MCG/ACT inhaler Inhale 1-2 puffs into the lungs every 6 (six) hours as needed for wheezing or shortness of breath. 09/17/14   Everlene FarrierWilliam Dansie, PA-C  ClonazePAM (KLONOPIN PO) Take by mouth.    Historical Provider, MD  fluticasone (FLONASE) 50 MCG/ACT nasal spray Place 1 spray into both nostrils as needed for allergies or rhinitis.    Historical Provider, MD  HYDROcodone-acetaminophen (NORCO) 5-325 MG per tablet Take 1 tablet by mouth every 6 (six) hours as needed for moderate  pain. 10/25/14   Lenda KelpShane R Hudnall, MD  methocarbamol (ROBAXIN) 500 MG tablet Take 1 tablet (500 mg total) by mouth every 8 (eight) hours as needed for muscle spasms. 10/11/14   Lenda KelpShane R Hudnall, MD  predniSONE (STERAPRED UNI-PAK) 10 MG tablet 6 tabs po day 1, 5 tabs po day 2, 4 tabs po day 3, 3 tabs po day 4, 2 tabs po day 5, 1 tab po day 6 10/11/14   Lenda KelpShane R Hudnall, MD  sertraline (ZOLOFT) 25 MG tablet Take 25 mg by mouth daily.    Historical Provider, MD   BP 110/77 mmHg  Pulse 90  Temp(Src) 98 F (36.7 C) (Oral)  Resp 20  Ht 5\' 4"  (1.626 m)  Wt 68.04 kg  BMI 25.73 kg/m2  SpO2 100%  LMP 08/05/2015 Physical Exam  Constitutional: She appears well-developed and well-nourished. No distress.  HENT:  Head: Atraumatic.  Eyes: Conjunctivae are normal.  Neck: Neck supple.  Musculoskeletal: She exhibits tenderness (L ankle: tenderness to lateral malleolar region with associated edema and mild ecchymosis.  no gross deformity. decreased ankle inversion/eversion 2/2 pain.  ).  L foot nontender  Neurological: She is alert.  Skin: No rash noted.  Small abrasion to L anterior knee.  Knee with FROM, no gross deformity  Psychiatric: She has a normal mood and affect.  Nursing note and vitals reviewed.   ED Course  Procedures (including critical care time)  Labs Review Labs Reviewed - No data to display  Imaging Review Dg Ankle Complete Left  08/12/2015  CLINICAL DATA:  Fall from truck with lateral ankle and foot pain, initial encounter EXAM: LEFT ANKLE COMPLETE - 3+ VIEW COMPARISON:  None. FINDINGS: There is no evidence of fracture, dislocation, or joint effusion. There is no evidence of arthropathy or other focal bone abnormality. Soft tissues are unremarkable. IMPRESSION: No acute abnormality noted. Electronically Signed   By: Alcide Clever M.D.   On: 08/12/2015 14:57   Dg Foot Complete Left  08/12/2015  CLINICAL DATA:  Fall out of truck today.  Lateral foot pain. EXAM: LEFT FOOT - COMPLETE 3+ VIEW  COMPARISON:  None. FINDINGS: There is no evidence of fracture or dislocation. There is no evidence of arthropathy or other focal bone abnormality. Soft tissues are unremarkable. IMPRESSION: Negative. Electronically Signed   By: Charlett Nose M.D.   On: 08/12/2015 14:56   I have personally reviewed and evaluated these images and lab results as part of my medical decision-making.   EKG Interpretation None      MDM   Final diagnoses:  Left ankle sprain, initial encounter    BP 110/77 mmHg  Pulse 90  Temp(Src) 98 F (36.7 C) (Oral)  Resp 20  Ht  (1.626 m)  Wt 68.04 kg  BMI 25.73 kg/m2  SpO2 100%  LMP 07/18/2015   L ankle mechanical injury.  Xray ordered. Ibuprofen given.    3:10 PM Xrays are neg for acute fx/dislocation.  ASO provided for support. RICE therapy discussed.  Fayrene Helper, PA-C 08/12/15 1510  Gwyneth Sprout, MD 08/12/15 1535

## 2015-09-13 ENCOUNTER — Emergency Department (HOSPITAL_BASED_OUTPATIENT_CLINIC_OR_DEPARTMENT_OTHER): Payer: Self-pay

## 2015-09-13 ENCOUNTER — Emergency Department (HOSPITAL_BASED_OUTPATIENT_CLINIC_OR_DEPARTMENT_OTHER)
Admission: EM | Admit: 2015-09-13 | Discharge: 2015-09-13 | Disposition: A | Discharge status: Home / Self Care | Payer: Self-pay | Attending: Emergency Medicine | Admitting: Emergency Medicine

## 2015-09-13 ENCOUNTER — Encounter (HOSPITAL_BASED_OUTPATIENT_CLINIC_OR_DEPARTMENT_OTHER): Payer: Self-pay | Admitting: Emergency Medicine

## 2015-09-13 DIAGNOSIS — Z87891 Personal history of nicotine dependence: Secondary | ICD-10-CM | POA: Insufficient documentation

## 2015-09-13 DIAGNOSIS — R300 Dysuria: Secondary | ICD-10-CM

## 2015-09-13 DIAGNOSIS — Z3202 Encounter for pregnancy test, result negative: Secondary | ICD-10-CM | POA: Insufficient documentation

## 2015-09-13 DIAGNOSIS — R109 Unspecified abdominal pain: Secondary | ICD-10-CM

## 2015-09-13 DIAGNOSIS — F410 Panic disorder [episodic paroxysmal anxiety] without agoraphobia: Secondary | ICD-10-CM | POA: Insufficient documentation

## 2015-09-13 DIAGNOSIS — N12 Tubulo-interstitial nephritis, not specified as acute or chronic: Secondary | ICD-10-CM | POA: Insufficient documentation

## 2015-09-13 DIAGNOSIS — Z8709 Personal history of other diseases of the respiratory system: Secondary | ICD-10-CM | POA: Insufficient documentation

## 2015-09-13 LAB — CBC WITH DIFFERENTIAL/PLATELET
BASOS ABS: 0.1 10*3/uL (ref 0.0–0.1)
Basophils Relative: 0 %
Eosinophils Absolute: 0 10*3/uL (ref 0.0–0.7)
Eosinophils Relative: 0 %
HCT: 44.2 % (ref 36.0–46.0)
Hemoglobin: 14.6 g/dL (ref 12.0–15.0)
LYMPHS ABS: 1.3 10*3/uL (ref 0.7–4.0)
Lymphocytes Relative: 9 %
MCH: 30.3 pg (ref 26.0–34.0)
MCHC: 33 g/dL (ref 30.0–36.0)
MCV: 91.7 fL (ref 78.0–100.0)
Monocytes Absolute: 0.8 10*3/uL (ref 0.1–1.0)
Monocytes Relative: 6 %
NEUTROS ABS: 11.6 10*3/uL — AB (ref 1.7–7.7)
Neutrophils Relative %: 85 %
PLATELETS: 289 10*3/uL (ref 150–400)
RBC: 4.82 MIL/uL (ref 3.87–5.11)
RDW: 12.7 % (ref 11.5–15.5)
WBC: 13.7 10*3/uL — AB (ref 4.0–10.5)

## 2015-09-13 LAB — COMPREHENSIVE METABOLIC PANEL
ALK PHOS: 94 U/L (ref 38–126)
ALT: 38 U/L (ref 14–54)
AST: 28 U/L (ref 15–41)
Albumin: 4.1 g/dL (ref 3.5–5.0)
Anion gap: 7 (ref 5–15)
BUN: 10 mg/dL (ref 6–20)
CALCIUM: 8.9 mg/dL (ref 8.9–10.3)
CHLORIDE: 104 mmol/L (ref 101–111)
CO2: 25 mmol/L (ref 22–32)
Creatinine, Ser: 0.75 mg/dL (ref 0.44–1.00)
GLUCOSE: 116 mg/dL — AB (ref 65–99)
Potassium: 3.9 mmol/L (ref 3.5–5.1)
Sodium: 136 mmol/L (ref 135–145)
Total Bilirubin: 1 mg/dL (ref 0.3–1.2)
Total Protein: 7.7 g/dL (ref 6.5–8.1)

## 2015-09-13 LAB — PREGNANCY, URINE: PREG TEST UR: NEGATIVE

## 2015-09-13 LAB — WET PREP, GENITAL
CLUE CELLS WET PREP: NONE SEEN
SPERM: NONE SEEN
TRICH WET PREP: NONE SEEN
WBC WET PREP: NONE SEEN
YEAST WET PREP: NONE SEEN

## 2015-09-13 LAB — URINALYSIS, ROUTINE W REFLEX MICROSCOPIC
Bilirubin Urine: NEGATIVE
Glucose, UA: NEGATIVE mg/dL
Ketones, ur: 40 mg/dL — AB
NITRITE: POSITIVE — AB
PROTEIN: 30 mg/dL — AB
Specific Gravity, Urine: 1.018 (ref 1.005–1.030)
pH: 6 (ref 5.0–8.0)

## 2015-09-13 LAB — URINE MICROSCOPIC-ADD ON

## 2015-09-13 LAB — LIPASE, BLOOD: Lipase: 22 U/L (ref 11–51)

## 2015-09-13 MED ORDER — HYDROCODONE-ACETAMINOPHEN 5-325 MG PO TABS: 1.0000 | ORAL_TABLET | Freq: Four times a day (QID) | ORAL | Status: DC | PRN

## 2015-09-13 MED ORDER — CIPROFLOXACIN HCL 500 MG PO TABS: 500.0000 mg | ORAL_TABLET | Freq: Two times a day (BID) | ORAL | Status: DC

## 2015-09-13 MED ORDER — SODIUM CHLORIDE 0.9 % IV SOLN
1000.0000 mL | Freq: Once | INTRAVENOUS | Status: AC
  Administered 2015-09-13: 1000 mL via INTRAVENOUS

## 2015-09-13 MED ORDER — PROMETHAZINE HCL 25 MG PO TABS: 25.0000 mg | ORAL_TABLET | Freq: Four times a day (QID) | ORAL | Status: DC | PRN

## 2015-09-13 MED ORDER — ONDANSETRON HCL 4 MG/2ML IJ SOLN
4.0000 mg | Freq: Once | INTRAMUSCULAR | Status: AC
  Administered 2015-09-13: 4 mg via INTRAVENOUS
  Filled 2015-09-13: qty 2

## 2015-09-13 MED ORDER — KETOROLAC TROMETHAMINE 30 MG/ML IJ SOLN
30.0000 mg | Freq: Once | INTRAMUSCULAR | Status: AC
  Administered 2015-09-13: 30 mg via INTRAVENOUS
  Filled 2015-09-13: qty 1

## 2015-09-13 MED ORDER — ACETAMINOPHEN 325 MG PO TABS
650.0000 mg | ORAL_TABLET | Freq: Once | ORAL | Status: AC
  Administered 2015-09-13: 650 mg via ORAL
  Filled 2015-09-13: qty 2

## 2015-09-13 MED ORDER — CEFTRIAXONE SODIUM 2 G IJ SOLR
2.0000 g | Freq: Once | INTRAMUSCULAR | Status: AC
  Administered 2015-09-13: 2 g via INTRAVENOUS
  Filled 2015-09-13: qty 2

## 2015-09-13 MED ORDER — SODIUM CHLORIDE 0.9 % IV SOLN
1000.0000 mL | INTRAVENOUS | Status: DC
  Administered 2015-09-13: 1000 mL via INTRAVENOUS

## 2015-09-13 MED ORDER — HYDROMORPHONE HCL 1 MG/ML IJ SOLN
0.5000 mg | INTRAMUSCULAR | Status: DC | PRN
  Administered 2015-09-13: 0.5 mg via INTRAVENOUS
  Filled 2015-09-13 (×2): qty 1

## 2015-09-13 MED ORDER — HYDROMORPHONE HCL 1 MG/ML IJ SOLN
1.0000 mg | Freq: Once | INTRAMUSCULAR | Status: AC
  Administered 2015-09-13: 1 mg via INTRAVENOUS

## 2015-09-13 MED FILL — PROMETHAZINE 25 MG TABLET: 25 | 7 days supply | Qty: 30 | Fill #0

## 2015-09-13 MED FILL — CIPROFLOXACIN HCL 500 MG TA: 500 | 10 days supply | Qty: 20 | Fill #0

## 2015-09-13 MED FILL — HYDROCODON-APAP 5-325: 5-325 | 3 days supply | Qty: 28 | Fill #0

## 2015-09-13 NOTE — Discharge Instructions (Signed)
Pyelonephritis, Adult °Pyelonephritis is a kidney infection. The kidneys are organs that help clean your blood by moving waste out of your blood and into your pee (urine). This infection can happen quickly, or it can last for a long time. In most cases, it clears up with treatment and does not cause other problems. °HOME CARE °Medicines °· Take over-the-counter and prescription medicines only as told by your doctor. °· Take your antibiotic medicine as told by your doctor. Do not stop taking the medicine even if you start to feel better. °General Instructions °· Drink enough fluid to keep your pee clear or pale yellow. °· Avoid caffeine, tea, and carbonated drinks. °· Pee (urinate) often. Avoid holding in pee for long periods of time. °· Pee before and after sex. °· After pooping (having a bowel movement), women should wipe from front to back. Use each tissue only once. °· Keep all follow-up visits as told by your doctor. This is important. °GET HELP IF: °· You do not feel better after 2 days. °· Your symptoms get worse. °· You have a fever. °GET HELP RIGHT AWAY IF: °· You cannot take your medicine or drink fluids as told. °· You have chills and shaking. °· You throw up (vomit). °· You have very bad pain in your side (flank) or back. °· You feel very weak or you pass out (faint). °  °This information is not intended to replace advice given to you by your health care provider. Make sure you discuss any questions you have with your health care provider. °  °Document Released: 10/01/2004 Document Revised: 05/15/2015 Document Reviewed: 12/17/2014 °Elsevier Interactive Patient Education ©2016 [employer] Inc. ° °

## 2015-09-13 NOTE — ED Notes (Signed)
Lower back pain for two days.  Increase urinary frequency and urgency.  Cold chills and hot spells.

## 2015-09-13 NOTE — ED Provider Notes (Signed)
CSN: [id number]     Arrival date & time 09/13/15  1941 History   First MD Initiated Contact with Patient 09/13/15 [phone]     Chief Complaint  Patient presents with  . Flank Pain   HPI Sx started a few days ago with dysuria.  She was urinating frequently.  She took some Azo to see if that will help. She started to have some chills.  Today she started to having increasing pain in  Her lower back.  It is now severe.  Initially it was on one side but now it moved to the other.  No fever or vomiting.  Slight discharge.  No vaginal bleeding.  The pain increases with movement .  Nothing helps.  Not sure if she is having abdominal pain because her back hurts so much. Past Medical History  Diagnosis Date  . Anxiety attack   . Panic attacks   . Bronchitis    Past Surgical History  Procedure Laterality Date  . Breast surgery    . Pilonidal cyst excision     No family history on file. Social History  Substance Use Topics  . Smoking status: Former Smoker -- 0.00 packs/day  . Smokeless tobacco: None  . Alcohol Use: No   OB History    No data available     Review of Systems  Constitutional: Negative for fever.  Cardiovascular: Negative for chest pain.  All other systems reviewed and are negative.     Allergies  Hepatitis b virus vaccine and Flexeril  Home Medications   Prior to Admission medications   Medication Sig Start Date End Date Taking? Authorizing Provider  ciprofloxacin (CIPRO) 500 MG tablet Take 1 tablet (500 mg total) by mouth every 12 (twelve) hours. 09/13/15   Linwood Dibbles, MD  ClonazePAM (KLONOPIN PO) Take by mouth.    Historical Provider, MD  HYDROcodone-acetaminophen (NORCO/VICODIN) 5-325 MG tablet Take 1-2 tablets by mouth every 6 (six) hours as needed. 09/13/15   Linwood Dibbles, MD  promethazine (PHENERGAN) 25 MG tablet Take 1 tablet (25 mg total) by mouth every 6 (six) hours as needed for nausea or vomiting. 09/13/15   Linwood Dibbles, MD   BP 94/58 mmHg  Pulse 108  Temp(Src) 98.2 F  (36.8 C) (Oral)  Resp 20  Ht 5\' 4"  (1.626 m)  Wt 68.04 kg  BMI 25.73 kg/m2  SpO2 97%  LMP 08/29/2015 Physical Exam  Constitutional: She appears well-developed and well-nourished. She appears distressed.  Appears to be in pain  HENT:  Head: Normocephalic and atraumatic.  Right Ear: External ear normal.  Left Ear: External ear normal.  Eyes: Conjunctivae are normal. Right eye exhibits no discharge. Left eye exhibits no discharge. No scleral icterus.  Neck: Neck supple. No tracheal deviation present.  Cardiovascular: Normal rate, regular rhythm and intact distal pulses.   Pulmonary/Chest: Effort normal and breath sounds normal. No stridor. No respiratory distress. She has no wheezes. She has no rales.  Abdominal: Soft. Bowel sounds are normal. She exhibits no distension, no ascites and no mass. There is generalized tenderness. There is guarding and CVA tenderness. There is no rigidity and no rebound. No hernia.  Genitourinary: Vagina normal and uterus normal. Cervix exhibits no motion tenderness. Right adnexum displays no mass, no tenderness and no fullness. Left adnexum displays no mass, no tenderness and no fullness. No vaginal discharge found.  Musculoskeletal: She exhibits no edema or tenderness.  Neurological: She is alert. She has normal strength. No cranial nerve deficit (no facial  droop, extraocular movements intact, no slurred speech) or sensory deficit. She exhibits normal muscle tone. She displays no seizure activity. Coordination normal.  Skin: Skin is warm and dry. No rash noted.  Psychiatric: She has a normal mood and affect.  Nursing note and vitals reviewed.   ED Course  Procedures (including critical care time) Labs Review Labs Reviewed  URINALYSIS, ROUTINE W REFLEX MICROSCOPIC (NOT AT [facility]) - Abnormal; Notable for the following:    Color, Urine ORANGE (*)    APPearance TURBID (*)    Hgb urine dipstick MODERATE (*)    Ketones, ur 40 (*)    Protein, ur 30 (*)     Nitrite POSITIVE (*)    Leukocytes, UA MODERATE (*)    All other components within normal limits  CBC WITH DIFFERENTIAL/PLATELET - Abnormal; Notable for the following:    WBC 13.7 (*)    Neutro Abs 11.6 (*)    All other components within normal limits  COMPREHENSIVE METABOLIC PANEL - Abnormal; Notable for the following:    Glucose, Bld 116 (*)    All other components within normal limits  URINE MICROSCOPIC-ADD ON - Abnormal; Notable for the following:    Squamous Epithelial / LPF TOO NUMEROUS TO COUNT (*)    Bacteria, UA MANY (*)    All other components within normal limits  WET PREP, GENITAL  URINE CULTURE  PREGNANCY, URINE  LIPASE, BLOOD  RPR  HIV ANTIBODY (ROUTINE TESTING)  GC/CHLAMYDIA PROBE AMP ([place]) NOT AT [facility]    Imaging Review Ct Renal Stone Study  09/13/2015  CLINICAL DATA:  Bilateral flank pain, several days duration. Dysuria. Personal history of kidney stones. Pain is worse on the right than the left. EXAM: CT ABDOMEN AND PELVIS WITHOUT CONTRAST TECHNIQUE: Multidetector CT imaging of the abdomen and pelvis was performed following the standard protocol without IV contrast. COMPARISON:  07/02/2011 FINDINGS: Mild atelectasis or scarring of both lung bases. No pleural or pericardial fluid. The liver is normal without contrast. No calcified gallstones. The spleen is normal. The pancreas is normal. The adrenal glands are normal. The right kidney is swollen and there is edema in the perinephric fat. No stone in the kidney. There is mild fullness of the right renal collecting system an ureter. I cannot identify an obstructing stone however. The left kidney is normal without evidence of swelling, stone, cyst or mass. No hydronephrosis on the left. The aorta and IVC are normal. No free intraperitoneal fluid or air. Uterus and adnexal regions are normal. No stone in the bladder. No bowel pathology is seen. No bony abnormality. IMPRESSION: Right renal pathology. The kidney is swollen  and edematous and there is perinephric edema. Mild fullness of the right renal collecting system and proximal ureter. This appearance could be due to pyelonephritis. I do not identify any hyperdense stone in the kidney or ureter. This appearance could also result from a recently passed stone. Electronically Signed   By: Paulina Fusi M.D.   On: 09/13/2015 12:01   I have personally reviewed and evaluated these images and lab results as part of my medical decision-making.  Medications  0.9 %  sodium chloride infusion (0 mLs Intravenous Stopped 09/13/15 1153)    Followed by  0.9 %  sodium chloride infusion (0 mLs Intravenous Stopped 09/13/15 1153)    Followed by  0.9 %  sodium chloride infusion (1,000 mLs Intravenous New Bag/Given 09/13/15 1153)  HYDROmorphone (DILAUDID) injection 0.5 mg (0.5 mg Intravenous Given 09/13/15 1150)  ketorolac (  TORADOL) 30 MG/ML injection 30 mg (not administered)  acetaminophen (TYLENOL) tablet 650 mg (not administered)  ondansetron (ZOFRAN) injection 4 mg (4 mg Intravenous Given 09/13/15 1038)  cefTRIAXone (ROCEPHIN) 2 g in dextrose 5 % 50 mL IVPB (0 g Intravenous Stopped 09/13/15 1111)  HYDROmorphone (DILAUDID) injection 1 mg (1 mg Intravenous Given 09/13/15 1038)    MDM   Final diagnoses:  Pyelonephritis    Patient's symptoms were suggestive of pyelonephritis. However, because of the significant pain she was having CT scan was performed to exclude the possibility of ruptured appendicitis or ureterolithiasis.  CT scan does show edema the right kidney as well as perinephric stranding. I think the symptoms do correlate with the clinical suspicion of pyelonephritis.  Patient has had chills in the emergency department. She was given a dose of Tylenol. Toradol and Dilaudid for her pain. A urine culture was sent off. Plan on discharge home with prescription for Cipro, hydrocodone and Phenergan.   Linwood DibblesJon Fillmore Bynum, MD 09/13/15 1242

## 2015-09-15 LAB — HIV ANTIBODY (ROUTINE TESTING W REFLEX): HIV Screen 4th Generation wRfx: NONREACTIVE

## 2015-09-16 LAB — GC/CHLAMYDIA PROBE AMP (~~LOC~~) NOT AT ARMC
CHLAMYDIA, DNA PROBE: NEGATIVE
Neisseria Gonorrhea: NEGATIVE

## 2015-09-17 LAB — URINE CULTURE

## 2015-09-18 ENCOUNTER — Telehealth (HOSPITAL_COMMUNITY): Payer: Self-pay

## 2015-09-18 LAB — RPR: RPR: NONREACTIVE

## 2015-09-18 NOTE — Telephone Encounter (Signed)
Post ED Visit - Positive Culture Follow-up  Culture report reviewed by antimicrobial stewardship pharmacist:  []  Enzo BiNathan Batchelder, Pharm.D. []  Celedonio MiyamotoJeremy Frens, [address]Ph[address]D., BCPS []  Garvin FilaMike Maccia, Pharm.D. []  Georgina PillionElizabeth Martin, Pharm.D., BCPS []  New DealMinh Pham, [place]arm[place]D., BCPS, AAHIVP []  Estella HuskMichelle Turner, Pharm.D., BCPS, AAHIVP []  Tennis Mustassie Stewart, Pharm.D. []  Sherle Poeob Vincent, [address]Ph[address]Jacinto Reap. X  Aubrey Jones, Pharm.D.  Positive urine culture, >/= 100,000 colonies -> E Coli Treated with Ciprofloxacin, organism sensitive to the same and no further patient follow-up is required at this time.   Dana RightClark, Liesa Tsan Dorn 09/18/2015, 10:42 AM

## 2016-04-16 ENCOUNTER — Encounter (HOSPITAL_BASED_OUTPATIENT_CLINIC_OR_DEPARTMENT_OTHER): Payer: Self-pay | Admitting: *Deleted

## 2016-04-16 ENCOUNTER — Emergency Department (HOSPITAL_BASED_OUTPATIENT_CLINIC_OR_DEPARTMENT_OTHER)
Admission: EM | Admit: 2016-04-16 | Discharge: 2016-04-16 | Disposition: A | Discharge status: Home / Self Care | Payer: Self-pay | Attending: Emergency Medicine | Admitting: Emergency Medicine

## 2016-04-16 DIAGNOSIS — Z87891 Personal history of nicotine dependence: Secondary | ICD-10-CM | POA: Insufficient documentation

## 2016-04-16 DIAGNOSIS — M546 Pain in thoracic spine: Secondary | ICD-10-CM | POA: Insufficient documentation

## 2016-04-16 MED ORDER — KETOROLAC TROMETHAMINE 30 MG/ML IJ SOLN
30.0000 mg | Freq: Once | INTRAMUSCULAR | Status: AC
  Administered 2016-04-16: 30 mg via INTRAMUSCULAR
  Filled 2016-04-16: qty 1

## 2016-04-16 MED ORDER — CYCLOBENZAPRINE HCL 10 MG PO TABS: 10.0000 mg | ORAL_TABLET | Freq: Three times a day (TID) | ORAL | 0 refills | Status: DC | PRN

## 2016-04-16 MED ORDER — NAPROXEN 500 MG PO TABS: 500.0000 mg | ORAL_TABLET | Freq: Two times a day (BID) | ORAL | 0 refills | Status: DC

## 2016-04-16 MED FILL — CYCLOBENZAPRINE 10 MG TAB: 10 | 10 days supply | Qty: 30 | Fill #0

## 2016-04-16 MED FILL — NAPROXEN 500 MG TABLET: 500 | 15 days supply | Qty: 30 | Fill #0

## 2016-04-16 NOTE — ED Triage Notes (Signed)
Mid back pain. Right arm pain. Pain makes her SOB. She is crying at triage. Drove herself.

## 2016-04-16 NOTE — ED Notes (Signed)
MD at bedside. 

## 2016-04-16 NOTE — ED Provider Notes (Signed)
[facility]   CSN: [id number] Arrival date & time: 04/16/16  1352  First Provider Contact:  First MD Initiated Contact with Patient 04/16/16 1512     History   Chief Complaint Chief Complaint  Patient presents with  . Back Pain    HPI Dana Giles is a 30 y.o. female.  Patient presents today with complaint of back pain. 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 She reports that she first noticed the back pain yesterday evening after work. She went to bed and slept through the night, but when she returned to work today her pain acutely worsened and she drove herself to the ED for evaluation. She describes the pain as sharp, 9/10, mid-right back pain. Pain worsens with inspiration and with movement of her R arm. She denies numbness, tingling, and pain in her arms. No chest pain / heart palpitations.       Past Medical History:  Diagnosis Date  . Anxiety attack   . Bronchitis   . Panic attacks     Patient Active Problem List   Diagnosis Date Noted  . Headache, migraine 07/25/2013  . Back pain, chronic 08/22/2012  . Anxiety and depression 08/22/2012    Past Surgical History:  Procedure Laterality Date  . BREAST SURGERY    . PILONIDAL CYST EXCISION      OB History    No data available       Home Medications    Prior to Admission medications   Medication Sig Start Date End Date Taking? Authorizing Provider  ciprofloxacin (CIPRO) 500 MG tablet Take 1 tablet (500 mg total) by mouth every 12 (twelve) hours. 09/13/15   Linwood Dibbles, MD  ClonazePAM (KLONOPIN PO) Take by mouth.    Historical Provider, MD  HYDROcodone-acetaminophen (NORCO/VICODIN) 5-325 MG tablet Take 1-2 tablets by mouth every 6 (six) hours as needed. 09/13/15   Linwood Dibbles, MD  promethazine (PHENERGAN) 25 MG tablet Take 1 tablet (25 mg total) by mouth every 6 (six) hours as needed for nausea or vomiting. 09/13/15   Linwood Dibbles, MD     Family History No family history on file.  Social History Social History  Substance Use Topics  . Smoking status: Former Smoker    Packs/day: 0.00  . Smokeless tobacco: Never Used  . Alcohol use No     Allergies   Hepatitis b virus vaccine and Flexeril [cyclobenzaprine]   Review of Systems Review of Systems  Constitutional: Negative.   HENT: Negative.   Eyes: Negative.   Respiratory: Negative.   Cardiovascular: Negative.  Negative for chest pain and palpitations.  Gastrointestinal: Negative.   Endocrine: Negative.   Genitourinary: Negative.   Musculoskeletal: Positive for back pain. Negative for gait problem, myalgias, neck pain and neck stiffness.  Skin: Negative.   Allergic/Immunologic: Negative.   Neurological: Negative.   Hematological: Negative.   Psychiatric/Behavioral: Negative.      Physical Exam Updated Vital Signs BP 109/94 (BP Location: Right Arm)   Pulse 116   Temp 97.9 F (36.6 C) (Oral)   Resp 18   Ht  (1.626 m)   Wt 68 kg   LMP 04/06/2016   SpO2 100%   BMI 25.75 kg/m   Physical Exam  Constitutional: She is oriented to person, place, and time. She appears well-developed and well-nourished. No distress.  HENT:  Head: Normocephalic and atraumatic.  Eyes: Conjunctivae are normal.  Neck: Neck supple.  Cardiovascular:  Normal rate and regular rhythm.   No murmur heard. Pulmonary/Chest: Effort normal and breath sounds normal. No respiratory distress.  Abdominal: Soft. There is no tenderness.  Musculoskeletal: Normal range of motion. She exhibits no edema.  R mid back tender to palpation over paravertebral muscles   Neurological: She is alert and oriented to person, place, and time.  Skin: Skin is warm and dry.  Psychiatric: She has a normal mood and affect.  Nursing note and vitals reviewed.    ED Treatments / Results  Labs (all labs ordered are listed, but only abnormal results are displayed) Labs Reviewed - No data to  display  EKG  EKG Interpretation None       Radiology No results found.  Procedures Procedures (including critical care time)  Medications Ordered in ED Medications - No data to display   Initial Impression / Assessment and Plan / ED Course  I have reviewed the triage vital signs and the nursing notes.  Pertinent labs & imaging results that were available during my care of the patient were reviewed by me and considered in my medical decision making (see chart for details).  Clinical Course   Back pain: Started yesterday evening after heavy lifting at work, acutely worsened today when patient returned to work. Likely MSK etiology. No imaging at this time. Patient given 30 mg IM toradol and sent home with naproxen and flexeril. Patient instructed to return on Saturday for MRI if pain worsens or does not improve.   Final Clinical Impressions(s) / ED Diagnoses   Final diagnoses:  None    New Prescriptions New Prescriptions   No medications on file     Reymundo Pollarolyn Lakeria Starkman, MD 04/16/16 1543    Nelva Nayobert Beaton, MD 04/17/16 1622

## 2016-04-16 NOTE — Discharge Instructions (Signed)
Please take Naproxen twice daily with meals and flexeril three times daily as needed for pain. If back pain worsens or does not improve, please return this Saturday (04/18/16) for further imaging and work up.

## 2016-09-11 ENCOUNTER — Emergency Department (HOSPITAL_COMMUNITY)
Admission: EM | Admit: 2016-09-11 | Discharge: 2016-09-11 | Disposition: A | Discharge status: Home / Self Care | Payer: Self-pay | Attending: Emergency Medicine | Admitting: Emergency Medicine

## 2016-09-11 ENCOUNTER — Encounter (HOSPITAL_COMMUNITY): Payer: Self-pay

## 2016-09-11 DIAGNOSIS — Y9289 Other specified places as the place of occurrence of the external cause: Secondary | ICD-10-CM | POA: Insufficient documentation

## 2016-09-11 DIAGNOSIS — S61212A Laceration without foreign body of right middle finger without damage to nail, initial encounter: Secondary | ICD-10-CM | POA: Insufficient documentation

## 2016-09-11 DIAGNOSIS — Z23 Encounter for immunization: Secondary | ICD-10-CM | POA: Insufficient documentation

## 2016-09-11 DIAGNOSIS — Y9389 Activity, other specified: Secondary | ICD-10-CM | POA: Insufficient documentation

## 2016-09-11 DIAGNOSIS — W25XXXA Contact with sharp glass, initial encounter: Secondary | ICD-10-CM | POA: Insufficient documentation

## 2016-09-11 DIAGNOSIS — F17210 Nicotine dependence, cigarettes, uncomplicated: Secondary | ICD-10-CM | POA: Insufficient documentation

## 2016-09-11 DIAGNOSIS — Y990 Civilian activity done for income or pay: Secondary | ICD-10-CM | POA: Insufficient documentation

## 2016-09-11 MED ORDER — TETANUS-DIPHTH-ACELL PERTUSSIS 5-2.5-18.5 LF-MCG/0.5 IM SUSP
0.5000 mL | Freq: Once | INTRAMUSCULAR | Status: AC
  Administered 2016-09-11: 0.5 mL via INTRAMUSCULAR
  Filled 2016-09-11: qty 0.5

## 2016-09-11 MED ORDER — ACETAMINOPHEN 500 MG PO TABS
1000.0000 mg | ORAL_TABLET | Freq: Once | ORAL | Status: AC
  Administered 2016-09-11: 1000 mg via ORAL
  Filled 2016-09-11: qty 2

## 2016-09-11 NOTE — ED Notes (Addendum)
Put saline and iodine solution in basin for pt to soak hand per Robert(PA)

## 2016-09-11 NOTE — ED Provider Notes (Signed)
[facility]-EMERGENCY DEPT Provider Note   CSN: [id number] Arrival date & time: 09/11/16  1103     History   Chief Complaint Chief Complaint  Patient presents with  . Hand Injury    HPI Dana Giles is a 31 y.o. female.  Patient presents to the emergency department with chief complaint of finger laceration. She states she cut her finger on a piece of glass. She states that she initially had difficulty controlling bleeding. She states that she has felt lightheaded at the site of her blood. She reports moderate pain from the injury. Last tetanus shot was in 2013. She reports some numbness and tingling of the fingertip, which is now resolved. She denies any other associated symptoms.   The history is provided by the patient. No language interpreter was used.    Past Medical History:  Diagnosis Date  . Anxiety attack   . Bronchitis   . Panic attacks     Patient Active Problem List   Diagnosis Date Noted  . Headache, migraine 07/25/2013  . Back pain, chronic 08/22/2012  . Anxiety and depression 08/22/2012    Past Surgical History:  Procedure Laterality Date  . BREAST SURGERY    . PILONIDAL CYST EXCISION      OB History    No data available       Home Medications    Prior to Admission medications   Medication Sig Start Date End Date Taking? Authorizing Provider  cyclobenzaprine (FLEXERIL) 10 MG tablet Take 1 tablet (10 mg total) by mouth 3 (three) times daily as needed for muscle spasms. 04/16/16   Reymundo Poll, MD  naproxen (NAPROSYN) 500 MG tablet Take 1 tablet (500 mg total) by mouth 2 (two) times daily with a meal. 04/16/16   Reymundo Poll, MD    Family History No family history on file.  Social History Social History  Substance Use Topics  . Smoking status: Current Every Day Smoker    Packs/day: 0.50    Types: Cigarettes  . Smokeless tobacco: Never Used  . Alcohol use No     Allergies   Hepatitis b virus vaccine and Flexeril  [cyclobenzaprine]   Review of Systems Review of Systems  Skin: Positive for wound.  All other systems reviewed and are negative.    Physical Exam Updated Vital Signs BP 109/82 (BP Location: Left Arm)   Pulse 99   Temp 98.5 F (36.9 C) (Oral)   Resp 18   Ht 5\' 6"  (1.676 m)   Wt 71.2 kg   SpO2 99%   BMI 25.34 kg/m   Physical Exam  Constitutional: She is oriented to person, place, and time. No distress.  HENT:  Head: Normocephalic and atraumatic.  Eyes: Conjunctivae and EOM are normal. Pupils are equal, round, and reactive to light.  Neck: No tracheal deviation present.  Cardiovascular: Normal rate.   Pulmonary/Chest: Effort normal. No respiratory distress.  Abdominal: Soft.  Musculoskeletal: Normal range of motion.  Neurological: She is alert and oriented to person, place, and time.  Skin: Skin is warm and dry. She is not diaphoretic.  1 cm very shallow laceration to distal aspect of right middle finger, no retained foreign body, no involvement  Psychiatric: Judgment normal.  Nursing note and vitals reviewed.    ED Treatments / Results  Labs (all labs ordered are listed, but only abnormal results are displayed) Labs Reviewed - No data to display  EKG  EKG Interpretation None       Radiology No results  found.  Procedures Procedures (including critical care time) LACERATION REPAIR Performed by: Roxy HorsemanBROWNING, Shaurya Rawdon Authorized by: Roxy HorsemanBROWNING, Zylah Elsbernd Consent: Verbal consent obtained. Risks and benefits: risks, benefits and alternatives were discussed Consent given by: patient Patient identity confirmed: provided demographic data Prepped and Draped in normal sterile fashion Wound explored  Laceration Location: right middle finger  Laceration Length: 1cm  No Foreign Bodies seen or palpated  Anesthesia: None  Local anesthetic: None  Anesthetic total: None  Irrigation method: syringe and soak Amount of cleaning: standard  Skin closure:  dermabond  Number of sutures: dermabond  Technique: dermabond  Patient tolerance: Patient tolerated the procedure well with no immediate complications.  Medications Ordered in ED Medications  acetaminophen (TYLENOL) tablet 1,000 mg (not administered)  Tdap (BOOSTRIX) injection 0.5 mL (not administered)     Initial Impression / Assessment and Plan / ED Course  I have reviewed the triage vital signs and the nursing notes.  Pertinent labs & imaging results that were available during my care of the patient were reviewed by me and considered in my medical decision making (see chart for details).  Clinical Course     She was very small, shallow laceration to right middle finger. No FB. Due to the shallow nature of the wound, sutures are not needed, however we will close the wound with Dermabond. Cleansed the wound in the emergency department. Tetanus shot updated. Discharged home in good condition.  Final Clinical Impressions(s) / ED Diagnoses   Final diagnoses:  Laceration of right middle finger without foreign body without damage to nail, initial encounter    New Prescriptions New Prescriptions   No medications on file     Roxy HorsemanRobert Terri Malerba, PA-C 09/11/16 1155    Loren Raceravid Yelverton, MD 09/16/16 1242

## 2016-09-11 NOTE — ED Triage Notes (Signed)
Per Pt, Pt is coming from work with reports of breaking a drinking glass on her right hand. Pt has laceration noted to her right middle finger. Complains of being dizzy at this time.

## 2016-09-13 ENCOUNTER — Emergency Department (HOSPITAL_COMMUNITY)
Admission: EM | Admit: 2016-09-13 | Discharge: 2016-09-14 | Disposition: A | Discharge status: Home / Self Care | Payer: Self-pay | Attending: Emergency Medicine | Admitting: Emergency Medicine

## 2016-09-13 ENCOUNTER — Encounter (HOSPITAL_COMMUNITY): Payer: Self-pay | Admitting: Emergency Medicine

## 2016-09-13 DIAGNOSIS — F17210 Nicotine dependence, cigarettes, uncomplicated: Secondary | ICD-10-CM | POA: Insufficient documentation

## 2016-09-13 DIAGNOSIS — Z7982 Long term (current) use of aspirin: Secondary | ICD-10-CM | POA: Insufficient documentation

## 2016-09-13 DIAGNOSIS — T40601A Poisoning by unspecified narcotics, accidental (unintentional), initial encounter: Secondary | ICD-10-CM

## 2016-09-13 DIAGNOSIS — T402X1A Poisoning by other opioids, accidental (unintentional), initial encounter: Secondary | ICD-10-CM | POA: Insufficient documentation

## 2016-09-13 LAB — COMPREHENSIVE METABOLIC PANEL
ALBUMIN: 4.4 g/dL (ref 3.5–5.0)
ALT: 21 U/L (ref 14–54)
AST: 18 U/L (ref 15–41)
Alkaline Phosphatase: 55 U/L (ref 38–126)
Anion gap: 6 (ref 5–15)
BUN: 9 mg/dL (ref 6–20)
CHLORIDE: 102 mmol/L (ref 101–111)
CO2: 29 mmol/L (ref 22–32)
Calcium: 8.7 mg/dL — ABNORMAL LOW (ref 8.9–10.3)
Creatinine, Ser: 0.65 mg/dL (ref 0.44–1.00)
GFR calc Af Amer: >60 mL/min (ref >60)
Glucose, Bld: 86 mg/dL (ref 65–99)
POTASSIUM: 3.4 mmol/L — AB (ref 3.5–5.1)
Sodium: 137 mmol/L (ref 135–145)
Total Bilirubin: 0.3 mg/dL (ref 0.3–1.2)
Total Protein: 6.9 g/dL (ref 6.5–8.1)

## 2016-09-13 LAB — CBC
HCT: 36.4 % (ref 36.0–46.0)
Hemoglobin: 12.2 g/dL (ref 12.0–15.0)
MCH: 30.6 pg (ref 26.0–34.0)
MCHC: 33.5 g/dL (ref 30.0–36.0)
MCV: 91.2 fL (ref 78.0–100.0)
PLATELETS: 319 10*3/uL (ref 150–400)
RBC: 3.99 MIL/uL (ref 3.87–5.11)
RDW: 12.9 % (ref 11.5–15.5)
WBC: 8.2 10*3/uL (ref 4.0–10.5)

## 2016-09-13 LAB — ETHANOL

## 2016-09-13 LAB — SALICYLATE LEVEL: Salicylate Lvl: <7 mg/dL (ref 2.8–30.0)

## 2016-09-13 LAB — ACETAMINOPHEN LEVEL: Acetaminophen (Tylenol), Serum: <10 ug/mL — ABNORMAL LOW (ref 10–30)

## 2016-09-13 LAB — CBG MONITORING, ED: Glucose-Capillary: 69 mg/dL (ref 65–99)

## 2016-09-13 MED ORDER — ONDANSETRON HCL 4 MG/2ML IJ SOLN
4.0000 mg | Freq: Once | INTRAMUSCULAR | Status: AC
  Administered 2016-09-13: 4 mg via INTRAVENOUS
  Filled 2016-09-13: qty 2

## 2016-09-13 MED ORDER — KETOROLAC TROMETHAMINE 30 MG/ML IJ SOLN
30.0000 mg | Freq: Once | INTRAMUSCULAR | Status: AC
  Administered 2016-09-13: 30 mg via INTRAVENOUS
  Filled 2016-09-13: qty 1

## 2016-09-13 MED ORDER — SODIUM CHLORIDE 0.9 % IV BOLUS (SEPSIS)
1000.0000 mL | Freq: Once | INTRAVENOUS | Status: AC
  Administered 2016-09-13: 1000 mL via INTRAVENOUS

## 2016-09-13 NOTE — ED Notes (Signed)
Bed: [id number] Expected date:  Expected time:  Means of arrival:  Comments: Narcotic overdose

## 2016-09-13 NOTE — ED Provider Notes (Signed)
[facility] DEPT Provider Note   CSN: [id number] Arrival date & time: 09/13/16  2230   By signing my name below, I, [name], attest that this documentation has been prepared under the direction and in the presence of Orpah Greek, MD. Electronically signed, [name], ED Scribe. 09/14/16. 12:01 AM.   History   Chief Complaint Chief Complaint  Patient presents with  . Drug Overdose   The history is provided by the patient and medical records. No language interpreter was used.    HPI Comments: Dana Giles is a 31 y.o. female BIB EMS who presents to the Emergency Department s/p a possible drug overdose. States she snorted a line of a powder and woke up to EMS. Notes associated headache, nausea, abdominal and generalized pain. Triage notes pt given Narcan and IV to the lefthand by EMS PTA. States she was put on a course of pain medications for chronic back pain several months ago.  Past Medical History:  Diagnosis Date  . Anxiety attack   . Bronchitis   . Panic attacks     Patient Active Problem List   Diagnosis Date Noted  . Headache, migraine 07/25/2013  . Back pain, chronic 08/22/2012  . Anxiety and depression 08/22/2012    Past Surgical History:  Procedure Laterality Date  . BREAST SURGERY     reduction  . PILONIDAL CYST EXCISION      OB History    No data available       Home Medications    Prior to Admission medications   Medication Sig Start Date End Date Taking? Authorizing Provider  albuterol (PROAIR HFA) 108 (90 Base) MCG/ACT inhaler Inhale 2 puffs into the lungs every 4 (four) hours as needed for wheezing. 09/17/14  Yes Historical Provider, MD  ALPRAZolam Duanne Moron) 1 MG tablet Take 0.5 mg by mouth 3 (three) times daily as needed for anxiety.   Yes Historical Provider, MD  aspirin-acetaminophen-caffeine (EXCEDRIN MIGRAINE) [phone] MG tablet Take 1 tablet by mouth every 6 (six) hours as needed for headache.   Yes Historical  Provider, MD  cyclobenzaprine (FLEXERIL) 10 MG tablet Take 1 tablet (10 mg total) by mouth 3 (three) times daily as needed for muscle spasms. Patient not taking: Reported on 09/11/2016 04/16/16   Velna Ochs, MD  naproxen (NAPROSYN) 500 MG tablet Take 1 tablet (500 mg total) by mouth 2 (two) times daily with a meal. Patient not taking: Reported on 09/11/2016 04/16/16   Velna Ochs, MD    Family History History reviewed. No pertinent family history.  Social History Social History  Substance Use Topics  . Smoking status: Current Every Day Smoker    Packs/day: 1.00    Types: Cigarettes  . Smokeless tobacco: Never Used  . Alcohol use No     Allergies   Hepatitis b virus vaccine and Flexeril [cyclobenzaprine]   Review of Systems Review of Systems  All other systems reviewed and are negative.  A complete 10 system review of systems was obtained and all systems are negative except as noted in the HPI and PMH.    Physical Exam Updated Vital Signs BP 96/64   Pulse 76   Temp 97.5 F (36.4 C) (Oral)   Resp 23   Ht _0  (1.626 m)   Wt 160 lb (72.6 kg)   LMP 08/31/2016   SpO2 98%   BMI 27.46 kg/m   Physical Exam  Constitutional: She is oriented to person, place, and time. She appears well-developed and well-nourished.  No distress.  HENT:  Head: Normocephalic and atraumatic.  Right Ear: Hearing normal.  Left Ear: Hearing normal.  Nose: Nose normal.  Mouth/Throat: Oropharynx is clear and moist and mucous membranes are normal.  Eyes: Conjunctivae and EOM are normal. Pupils are equal, round, and reactive to light.  Neck: Normal range of motion. Neck supple.  Cardiovascular: Regular rhythm, S1 normal and S2 normal.  Exam reveals no gallop and no friction rub.   No murmur heard. Pulmonary/Chest: Effort normal and breath sounds normal. No respiratory distress. She exhibits no tenderness.  Abdominal: Soft. Normal appearance and bowel sounds are normal. There is no  hepatosplenomegaly. There is no tenderness. There is no rebound, no guarding, no tenderness at McBurney's point and negative Murphy's sign. No hernia.  Musculoskeletal: Normal range of motion.  Neurological: She is alert and oriented to person, place, and time. She has normal strength. No cranial nerve deficit or sensory deficit. Coordination normal. GCS eye subscore is 4. GCS verbal subscore is 5. GCS motor subscore is 6.  Skin: Skin is warm, dry and intact. No rash noted. No cyanosis.  Psychiatric: She has a normal mood and affect. Her speech is normal and behavior is normal. Thought content normal.  Nursing note and vitals reviewed.    ED Treatments / Results  DIAGNOSTIC STUDIES: Oxygen Saturation is 98% on AR, normal by my interpretation.    COORDINATION OF CARE: 12:01 AM Discussed treatment plan with pt at bedside and pt agreed to plan.  Labs (all labs ordered are listed, but only abnormal results are displayed) Labs Reviewed  COMPREHENSIVE METABOLIC PANEL - Abnormal; Notable for the following:       Result Value   Potassium 3.4 (*)    Calcium 8.7 (*)    All other components within normal limits  ACETAMINOPHEN LEVEL - Abnormal; Notable for the following:    Acetaminophen (Tylenol), Serum <10 (*)    All other components within normal limits  ETHANOL  SALICYLATE LEVEL  CBC  CBG MONITORING, ED    EKG  EKG Interpretation None       Radiology No results found.  Procedures Procedures (including critical care time)  Medications Ordered in ED Medications  ondansetron (ZOFRAN) injection 4 mg (4 mg Intravenous Given 09/13/16 2357)  sodium chloride 0.9 % bolus 1,000 mL (0 mLs Intravenous Stopped 09/14/16 0055)  ketorolac (TORADOL) 30 MG/ML injection 30 mg (30 mg Intravenous Given 09/13/16 2357)     Initial Impression / Assessment and Plan / ED Course  I have reviewed the triage vital signs and the nursing notes.  Pertinent labs & imaging results that were available during  my care of the patient were reviewed by me and considered in my medical decision making (see chart for details).  Clinical Course    Patient presents to the emergency department for evaluation after overdose of unknown substance. Patient reports snorting a white powder with an individual she had just met. She reportedly became unresponsive. She was administered Narcan at which time she became awake and alert. Patient has been monitored for an extended period of time here in the ER and there has not been any recurrent sedation or respiratory depression.  Final Clinical Impressions(s) / ED Diagnoses   Final diagnoses:  Opiate overdose, accidental or unintentional, initial encounter    New Prescriptions New Prescriptions   No medications on file  I personally performed the services described in this documentation, which was scribed in my presence. The recorded information has been reviewed  and is accurate.     Orpah Greek, MD 09/14/16 [phone]

## 2016-09-13 NOTE — ED Triage Notes (Signed)
Per EMS pt reported to snorting unknown narcotic tonight. Pt reports that she was trying to get high. 20 ga IV to left hand established by EMS. 2mg  Narcan IM given by EMS prior to arrival. Pt currently alert and oriented x 4 and reporting nausea.

## 2017-08-15 IMAGING — CR DG FOOT COMPLETE 3+V*L*
3 series · 3 of 3 positions shown · non-contrast
Comparison: None.

CLINICAL DATA: Fall out of truck today.  Lateral foot pain.

EXAM:
LEFT FOOT - COMPLETE 3+ VIEW

[t foot ap left]
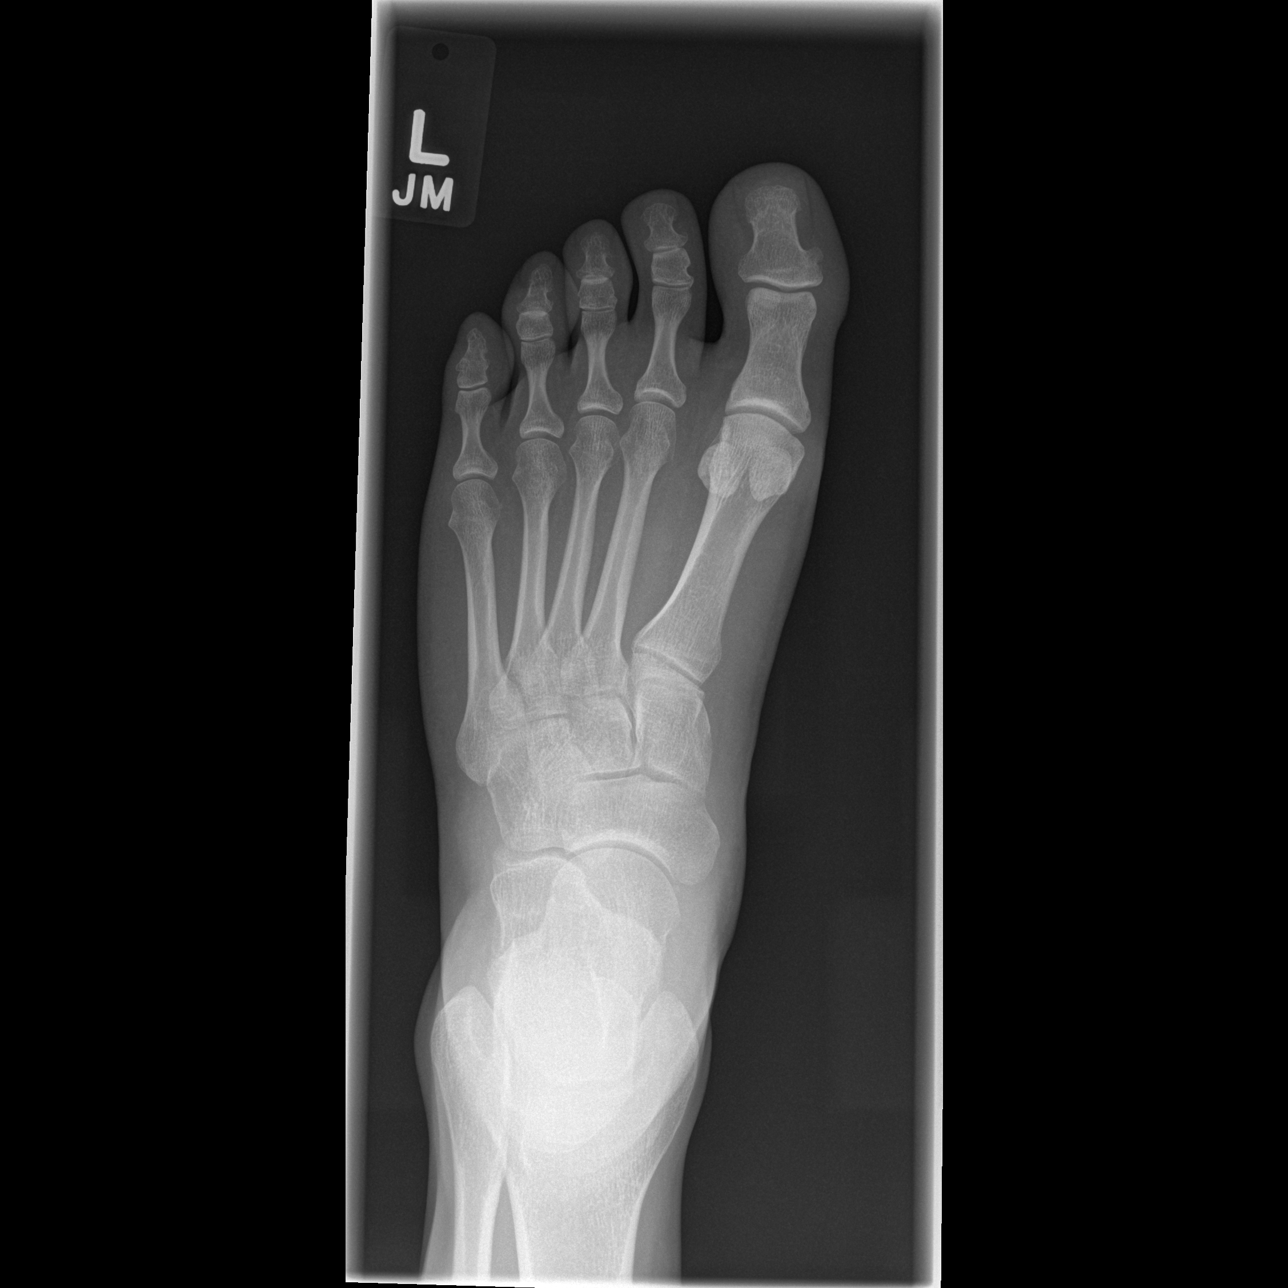

[t foot oblique left]
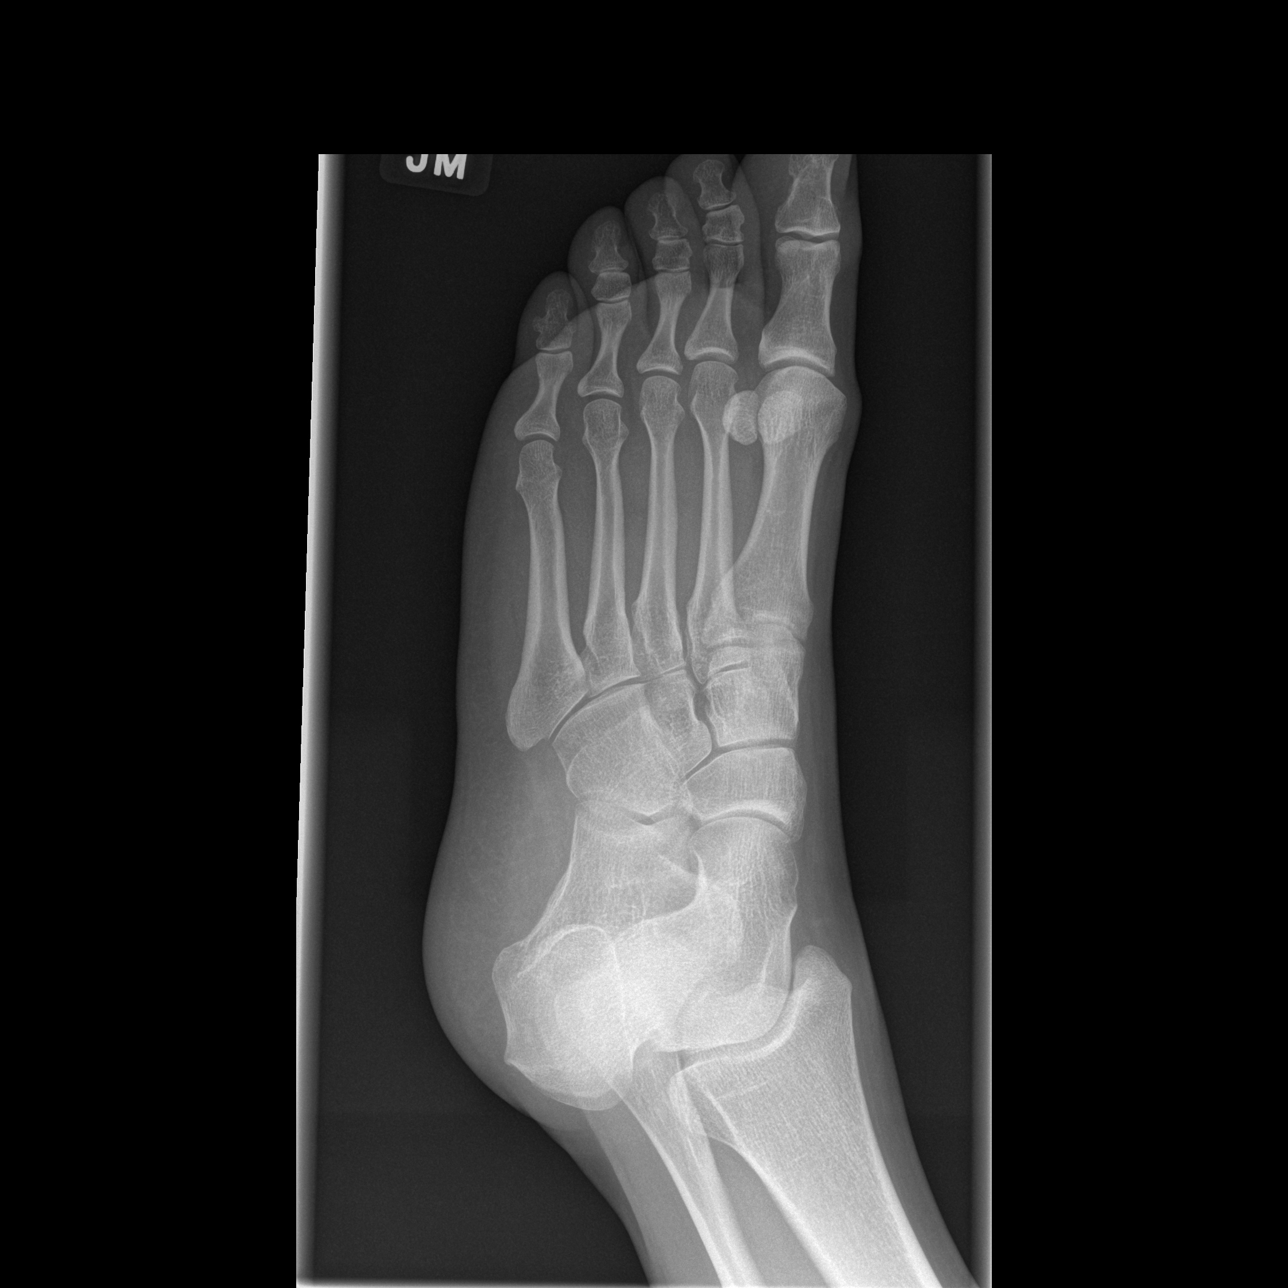

[t foot lat left]
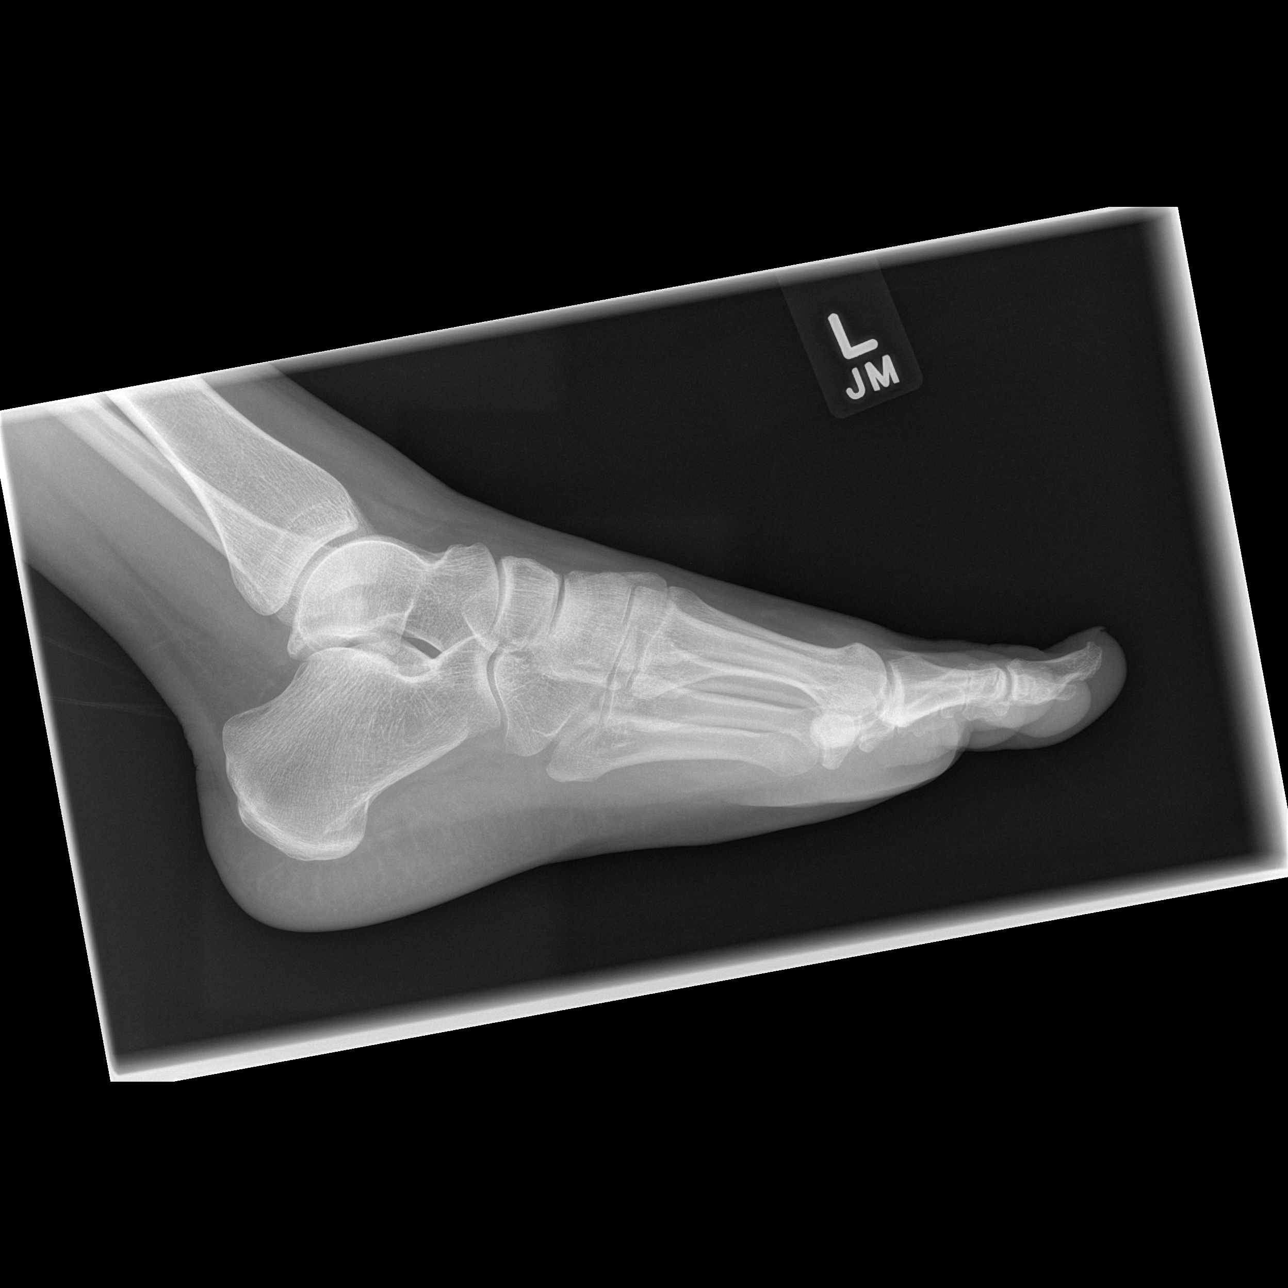

[3 of 3 positions shown; findings below may reference images not displayed]

FINDINGS: There is no evidence of fracture or dislocation. There is no
evidence of arthropathy or other focal bone abnormality. Soft
tissues are unremarkable.
IMPRESSION: Negative.

## 2017-08-15 IMAGING — CR DG ANKLE COMPLETE 3+V*L*
3 series · 3 of 3 positions shown · non-contrast
Comparison: None.

CLINICAL DATA: Fall from truck with lateral ankle and foot pain,
initial encounter

EXAM:
LEFT ANKLE COMPLETE - 3+ VIEW

[t ankle joint lat left]
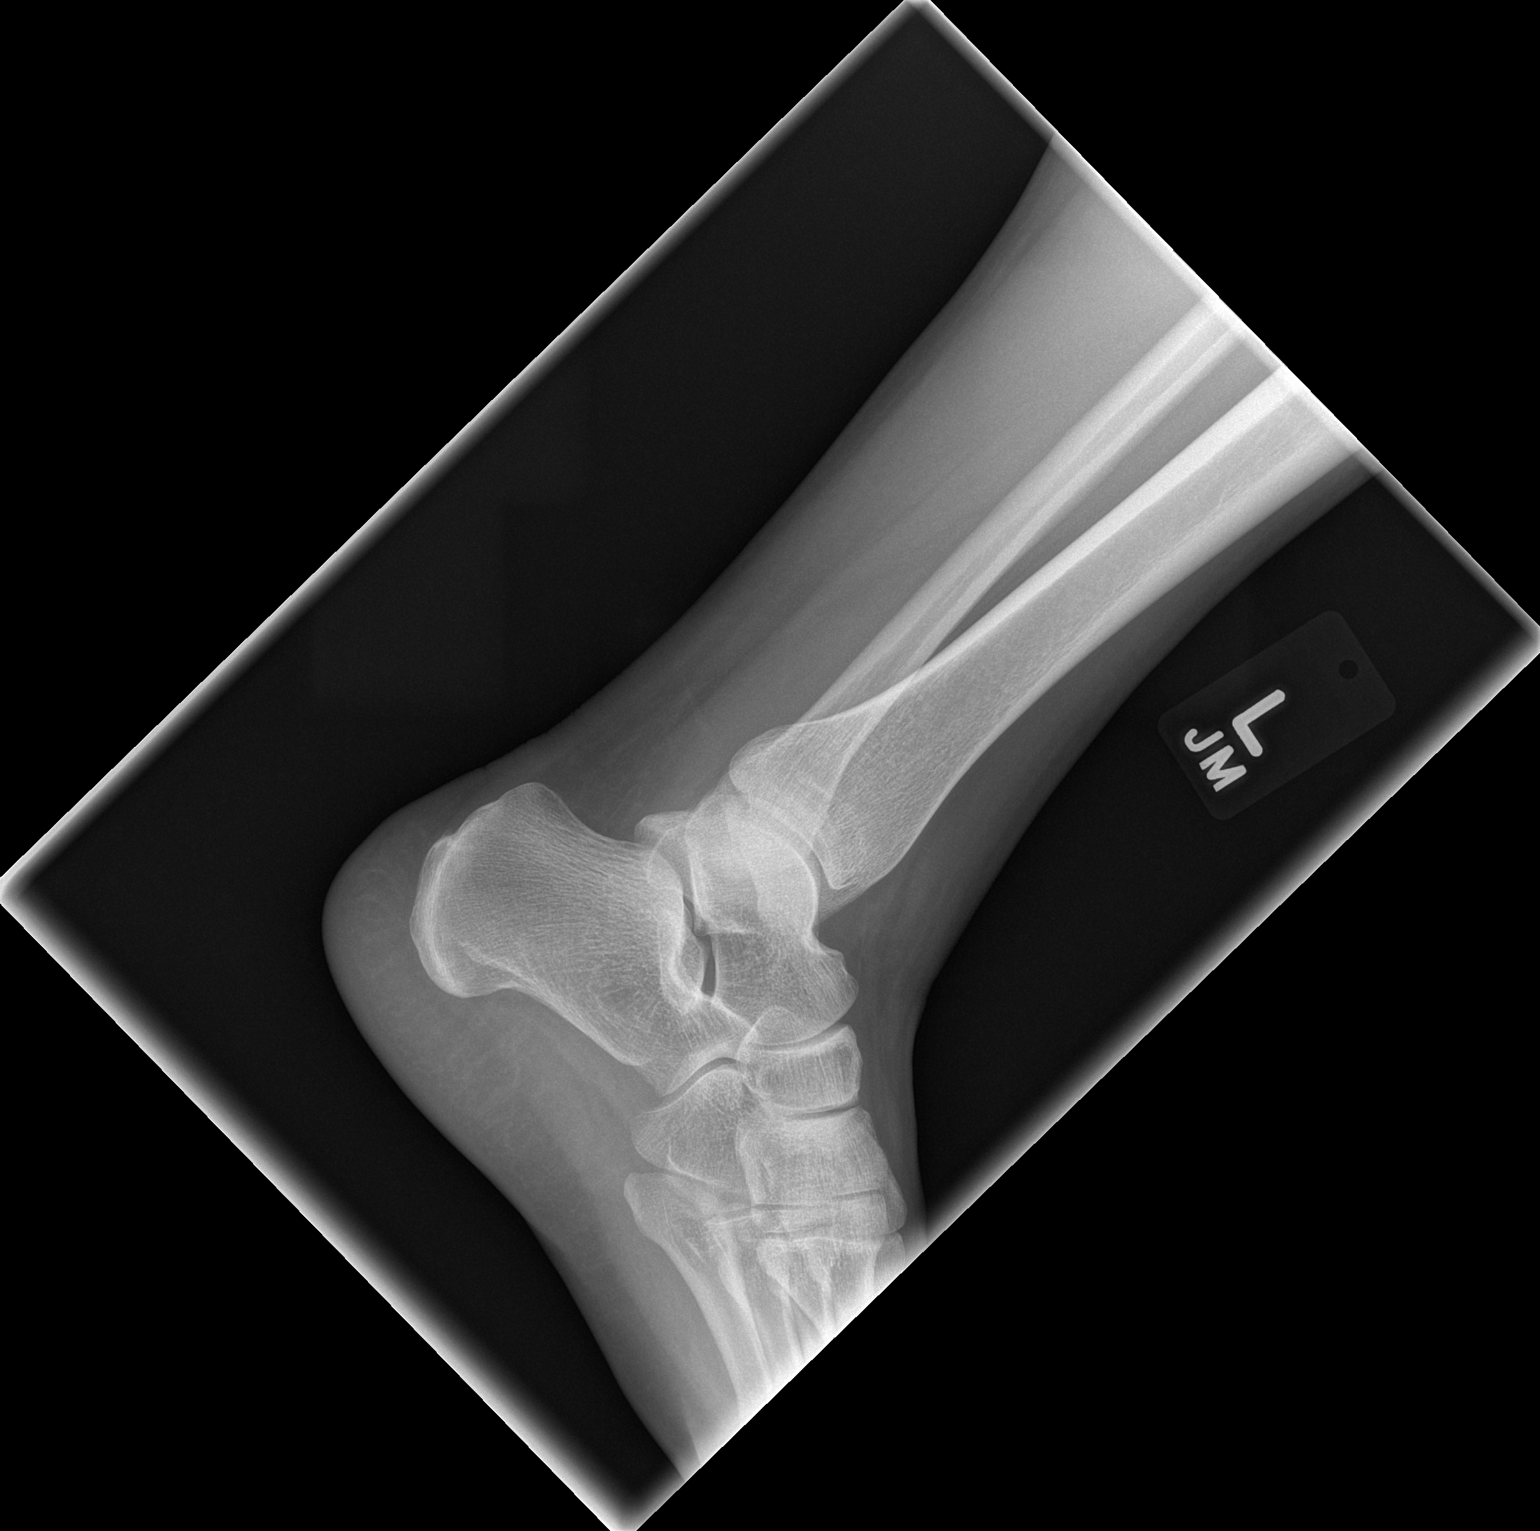

[t ankle joint ap left]
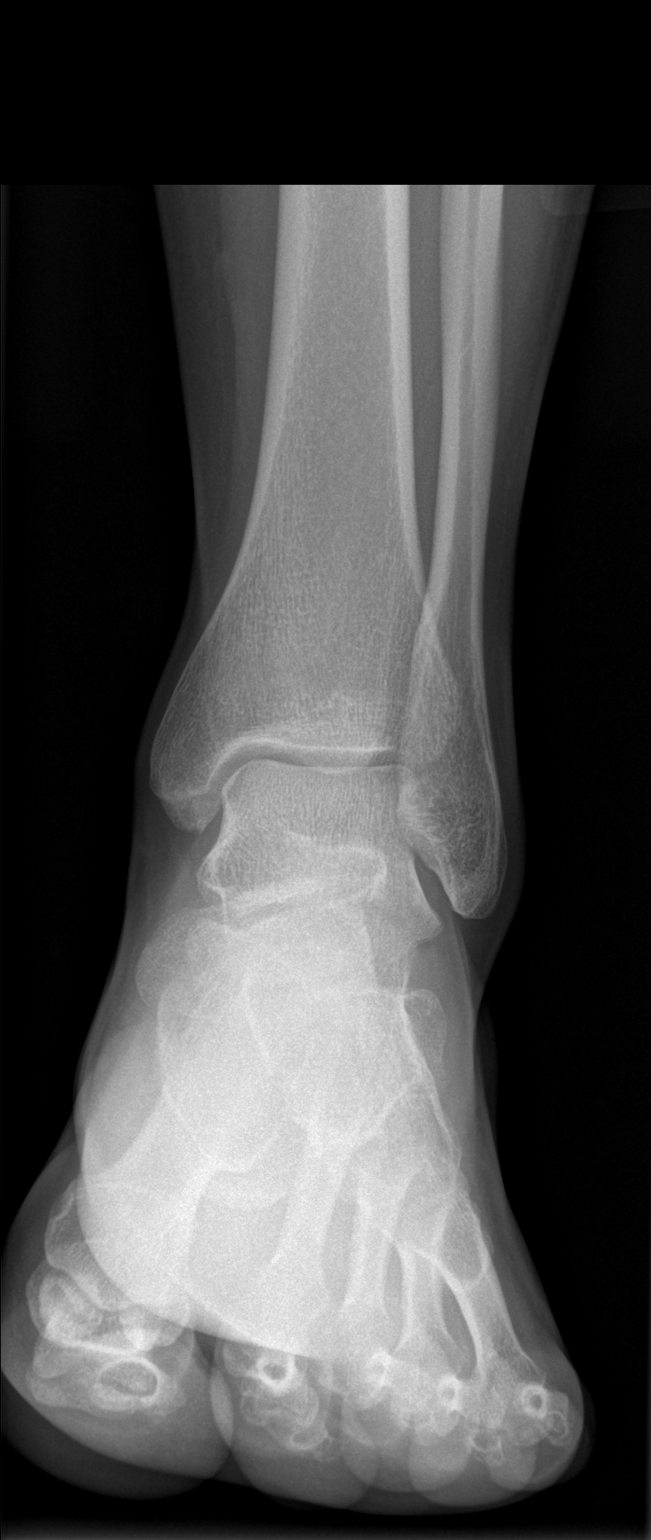

[t ankle joint oblique left]
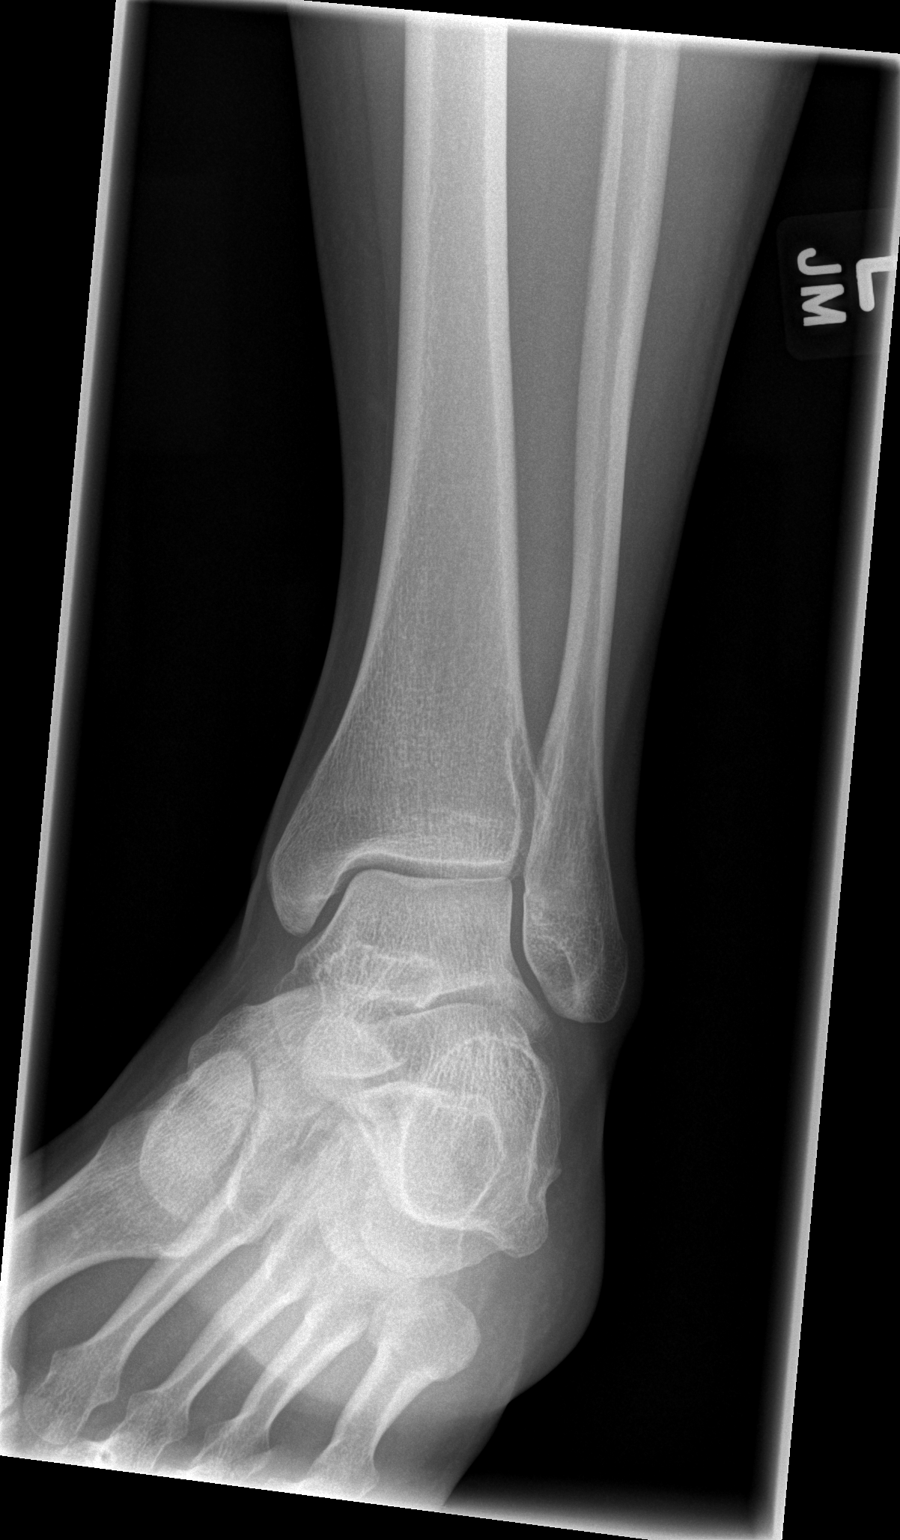

[3 of 3 positions shown; findings below may reference images not displayed]

FINDINGS: There is no evidence of fracture, dislocation, or joint effusion.
There is no evidence of arthropathy or other focal bone abnormality.
Soft tissues are unremarkable.
IMPRESSION: No acute abnormality noted.

## 2018-08-25 ENCOUNTER — Emergency Department (HOSPITAL_COMMUNITY): Payer: PRIVATE HEALTH INSURANCE

## 2018-08-25 ENCOUNTER — Other Ambulatory Visit: Payer: Self-pay

## 2018-08-25 ENCOUNTER — Emergency Department (HOSPITAL_COMMUNITY)
Admission: EM | Admit: 2018-08-25 | Discharge: 2018-08-25 | Disposition: A | Discharge status: Home / Self Care | Payer: PRIVATE HEALTH INSURANCE | Attending: Emergency Medicine | Admitting: Emergency Medicine

## 2018-08-25 ENCOUNTER — Encounter (HOSPITAL_COMMUNITY): Payer: Self-pay

## 2018-08-25 DIAGNOSIS — F17210 Nicotine dependence, cigarettes, uncomplicated: Secondary | ICD-10-CM | POA: Insufficient documentation

## 2018-08-25 DIAGNOSIS — R51 Headache: Secondary | ICD-10-CM | POA: Insufficient documentation

## 2018-08-25 DIAGNOSIS — R519 Headache, unspecified: Secondary | ICD-10-CM

## 2018-08-25 LAB — CBC
HEMATOCRIT: 41.4 % (ref 36.0–46.0)
HEMOGLOBIN: 13 g/dL (ref 12.0–15.0)
MCH: 28.4 pg (ref 26.0–34.0)
MCHC: 31.4 g/dL (ref 30.0–36.0)
MCV: 90.6 fL (ref 80.0–100.0)
NRBC: 0 % (ref 0.0–0.2)
Platelets: 403 10*3/uL — ABNORMAL HIGH (ref 150–400)
RBC: 4.57 MIL/uL (ref 3.87–5.11)
RDW: 13.4 % (ref 11.5–15.5)
WBC: 8.4 10*3/uL (ref 4.0–10.5)

## 2018-08-25 LAB — BASIC METABOLIC PANEL
ANION GAP: 12 (ref 5–15)
BUN: 15 mg/dL (ref 6–20)
CALCIUM: 9.5 mg/dL (ref 8.9–10.3)
CO2: 24 mmol/L (ref 22–32)
Chloride: 103 mmol/L (ref 98–111)
Creatinine, Ser: 0.75 mg/dL (ref 0.44–1.00)
GLUCOSE: 96 mg/dL (ref 70–99)
POTASSIUM: 4.1 mmol/L (ref 3.5–5.1)
Sodium: 139 mmol/L (ref 135–145)

## 2018-08-25 MED ORDER — DIPHENHYDRAMINE HCL 50 MG/ML IJ SOLN
12.5000 mg | Freq: Once | INTRAMUSCULAR | Status: AC
Start: 2018-08-25 — End: 2018-08-25
  Administered 2018-08-25: 12.5 mg via INTRAVENOUS
  Filled 2018-08-25: qty 1

## 2018-08-25 MED ORDER — KETOROLAC TROMETHAMINE 30 MG/ML IJ SOLN
30.0000 mg | Freq: Once | INTRAMUSCULAR | Status: AC
  Administered 2018-08-25: 30 mg via INTRAVENOUS
  Filled 2018-08-25: qty 1

## 2018-08-25 MED ORDER — PROCHLORPERAZINE EDISYLATE 10 MG/2ML IJ SOLN
10.0000 mg | Freq: Once | INTRAMUSCULAR | Status: AC
  Administered 2018-08-25: 10 mg via INTRAVENOUS
  Filled 2018-08-25: qty 2

## 2018-08-25 MED ORDER — SODIUM CHLORIDE 0.9 % IV BOLUS
1000.0000 mL | Freq: Once | INTRAVENOUS | Status: AC
  Administered 2018-08-25: 1000 mL via INTRAVENOUS

## 2018-08-25 NOTE — ED Notes (Signed)
Pt transported to CT ?

## 2018-08-25 NOTE — ED Triage Notes (Signed)
Pt states that she was recently seen at her PCP for a headache and placed on ABX for sinuses. Pt reports since taking them she feels like she is "coming out of a fog" also reports that she had OD on Fentanyl about a year and a half ago. Pt reports that her cognitive function has been declining over the past 5 years stating "the grip that was on my head released and my memory started flooding back to me". Also stating that "the more Im thinking and my memory is coming back, the more my head is hurting". Pt also states she hit her head 3 years ago "busting my head open and getting 2 black eyes but I was never seen for it".

## 2018-08-25 NOTE — Discharge Instructions (Addendum)
Follow-up with your primary care doctor, discuss seeing a neurologist for further evaluation of your memory and cognitive issues

## 2018-08-25 NOTE — ED Provider Notes (Signed)
[facility] Provider [facility]   CSN: [id number] Arrival date & time: 08/25/18  1742     History   Chief Complaint Chief Complaint  Patient presents with  . Headache    HPI Dana Giles is a 32 y.o. female.  HPI Patient presents emergency room for evaluation of headache.  Patient states she started having a headache this past week.  She felt like she had some sinus congestion and pressure.  She went to see her primary care doctor earlier this week and was started on antibiotics.  Patient states since starting the antibiotics she feels like her headache has gotten worse however she feels like her memory has improved significantly as if she is coming out of a fog.  Patient states over the last 5 years she has felt that her cognitive function has been declining.  She feels as if her speech had not been normal.  Patient states she never saw a neurologist or had any brain imaging over these last 5 years.  Patient feels that for some reason the antibiotic that she took the other day treated an infection that was causing all of her cognitive issues.  Patient has had some remote head injury but was never evaluated for it. Past Medical History:  Diagnosis Date  . Anxiety attack   . Bronchitis   . Panic attacks     Patient Active Problem List   Diagnosis Date Noted  . Headache, migraine 07/25/2013  . Back pain, chronic 08/22/2012  . Anxiety and depression 08/22/2012    Past Surgical History:  Procedure Laterality Date  . BREAST SURGERY     reduction  . PILONIDAL CYST EXCISION       OB History   No obstetric history on file.      Home Medications    Prior to Admission medications   Medication Sig Start Date End Date Taking? Authorizing Provider  albuterol (PROAIR HFA) 108 (90 Base) MCG/ACT inhaler Inhale 2 puffs into the lungs every 4 (four) hours as needed for wheezing. 09/17/14   [provider]  ALPRAZolam Prudy Feeler(XANAX) 1 MG tablet  Take 0.5 mg by mouth 3 (three) times daily as needed for anxiety.    [provider]  aspirin-acetaminophen-caffeine (EXCEDRIN MIGRAINE) [phone] MG tablet Take 1 tablet by mouth every 6 (six) hours as needed for headache.    [provider]  cyclobenzaprine (FLEXERIL) 10 MG tablet Take 1 tablet (10 mg total) by mouth 3 (three) times daily as needed for muscle spasms. Patient not taking: Reported on 09/11/2016 04/16/16   Reymundo PollGuilloud, Carolyn, MD  naproxen (NAPROSYN) 500 MG tablet Take 1 tablet (500 mg total) by mouth 2 (two) times daily with a meal. Patient not taking: Reported on 09/11/2016 04/16/16   Reymundo PollGuilloud, Carolyn, MD    Family History No family history on file.  Social History Social History   Tobacco Use  . Smoking status: Current Every Day Smoker    Packs/day: 1.00    Types: Cigarettes  . Smokeless tobacco: Never Used  Substance Use Topics  . Alcohol use: Not Currently    Alcohol/week: 0.0 standard drinks  . Drug use: Not Currently    Comment: Last used 2 years ago, on methadone now      Allergies   Hepatitis b virus vaccine and Flexeril [cyclobenzaprine]   Review of Systems Review of Systems  Constitutional: Negative for fever.  Respiratory: Negative for shortness of breath.   Cardiovascular: Negative for chest pain.  Neurological: Positive for headaches. Negative for seizures, syncope and numbness.  All other systems reviewed and are negative.    Physical Exam Updated Vital Signs BP (!) 149/105 (BP Location: Right Arm)   Pulse (!) 109   Temp 98.5 F (36.9 C) (Oral)   Resp 18   Ht 1.626 m (5\' 4" )   Wt 81.6 kg   LMP 08/05/2018   SpO2 99%   BMI 30.90 kg/m   Physical Exam Vitals signs and nursing note reviewed.  Constitutional:      General: She is not in acute distress.    Appearance: She is well-developed.  HENT:     Head: Normocephalic and atraumatic.     Right Ear: External ear normal.     Left Ear: External ear normal.  Eyes:      General: No scleral icterus.       Right eye: No discharge.        Left eye: No discharge.     Conjunctiva/sclera: Conjunctivae normal.  Neck:     Musculoskeletal: Neck supple.     Trachea: No tracheal deviation.  Cardiovascular:     Rate and Rhythm: Normal rate and regular rhythm.  Pulmonary:     Effort: Pulmonary effort is normal. No respiratory distress.     Breath sounds: Normal breath sounds. No stridor. No wheezing or rales.  Abdominal:     General: Bowel sounds are normal. There is no distension.     Palpations: Abdomen is soft.     Tenderness: There is no abdominal tenderness. There is no guarding or rebound.  Musculoskeletal:        General: No tenderness.  Skin:    General: Skin is warm and dry.     Findings: No rash.  Neurological:     Mental Status: She is alert.     Cranial Nerves: No cranial nerve deficit (no facial droop, extraocular movements intact, no slurred speech).     Sensory: No sensory deficit.     Motor: No abnormal muscle tone or seizure activity.     Coordination: Coordination normal.      ED Treatments / Results  Labs (all labs ordered are listed, but only abnormal results are displayed) Labs Reviewed  CBC - Abnormal; Notable for the following components:      Result Value   Platelets 403 (*)    All other components within normal limits  BASIC METABOLIC PANEL    EKG None  Radiology Ct Head Wo Contrast  Result Date: 08/25/2018 CLINICAL DATA:  Headache EXAM: CT HEAD WITHOUT CONTRAST TECHNIQUE: Contiguous axial images were obtained from the base of the skull through the vertex without intravenous contrast. COMPARISON:  CT 06/07/2004 FINDINGS: Brain: No acute territorial infarction, hemorrhage or intracranial mass. Nonenlarged ventricles. Vascular: No hyperdense vessels.  No unexpected calcification Skull: Normal. Negative for fracture or focal lesion. Sinuses/Orbits: Fluid levels in the maxillary sinuses with mucosal thickening Other: None  IMPRESSION: 1. Negative non contrasted CT appearance of the brain 2. Sinusitis Electronically Signed   By: Jasmine PangKim  Fujinaga M.D.   On: 08/25/2018 18:29    Procedures Procedures (including critical care time)  Medications Ordered in ED Medications  sodium chloride 0.9 % bolus 1,000 mL (1,000 mLs Intravenous New Bag/Given 08/25/18 1833)  ketorolac (TORADOL) 30 MG/ML injection 30 mg (30 mg Intravenous Given 08/25/18 1834)  prochlorperazine (COMPAZINE) injection 10 mg (10 mg Intravenous Given 08/25/18 1837)  diphenhydrAMINE (BENADRYL) injection 12.5 mg (12.5 mg Intravenous Given 08/25/18 1836)  Initial Impression / Assessment and Plan / ED Course  I have reviewed the triage vital signs and the nursing notes.  Pertinent labs & imaging results that were available during my care of the patient were reviewed by me and considered in my medical decision making (see chart for details).   Patient's laboratory tests and CT scan are reassuring.  It does show evidence of sinusitis.  She is currently taking antibiotics for that.  I reassured the patient that I do not see any structural abnormalities including tumor or abscess that would account for this 5 years of headache.  I suggested she might want to see a neurologist because of her cognitive concerns.  At this time I do not see any evidence of any emergency medical condition.  The patient symptoms have improved with treatment.  She is stable for discharge.  Final Clinical Impressions(s) / ED Diagnoses   Final diagnoses:  Sinus headache    ED Discharge Orders    None       Linwood Dibbles, MD 08/25/18 1916

## 2020-10-18 NOTE — Progress Notes (Signed)
  Self Swab Type: Anterior Nasal

## 2022-07-21 ENCOUNTER — Other Ambulatory Visit: Payer: Self-pay | Admitting: Family Medicine

## 2022-07-21 DIAGNOSIS — R1032 Left lower quadrant pain: Secondary | ICD-10-CM

## 2022-07-22 ENCOUNTER — Ambulatory Visit
Admission: RE | Admit: 2022-07-22 | Discharge: 2022-07-22 | Disposition: A | Discharge status: Home / Self Care | Payer: BC Managed Care – PPO | Source: Ambulatory Visit | Attending: Family Medicine | Admitting: Family Medicine

## 2022-07-22 DIAGNOSIS — R1032 Left lower quadrant pain: Secondary | ICD-10-CM

## 2022-07-22 MED ORDER — IOPAMIDOL (ISOVUE-300) INJECTION 61%
100.0000 mL | Freq: Once | INTRAVENOUS | Status: AC | PRN
  Administered 2022-07-22: 100 mL via INTRAVENOUS

## 2024-01-21 ENCOUNTER — Other Ambulatory Visit: Payer: Self-pay

## 2024-01-21 ENCOUNTER — Encounter (HOSPITAL_COMMUNITY): Payer: Self-pay | Admitting: Obstetrics and Gynecology

## 2024-01-21 NOTE — Progress Notes (Signed)
 PCP - Lawernce Presto, NP    Anesthesia review: N  Patient verbally denies any shortness of breath, fever, cough and chest pain during phone call   -------------  SDW INSTRUCTIONS given:  Your procedure is scheduled on Monday, May 19th.  Report to [facility] Main Entrance "A" at 0530 A.M., and check in at the Admitting office.  Call this number if you have problems the morning of surgery:  [phone]   Remember:  Do not eat after midnight the night before your surgery  You may drink clear liquids until 0430 the morning of your surgery.   Clear liquids allowed are: Water, Non-Citrus Juices (without pulp), Carbonated Beverages, Clear Tea, Black Coffee Only, and Gatorade    Take these medicines the morning of surgery with A SIP OF WATER  Maxalt-if needed  As of today, STOP taking any Aspirin (unless otherwise instructed by your surgeon) Aleve , Naproxen , Ibuprofen , Motrin , Advil , Goody's, BC's, all herbal medications, fish oil, and all vitamins.                      Do not wear jewelry, make up, or nail polish            Do not wear lotions, powders, perfumes/colognes, or deodorant.            Do not shave 48 hours prior to surgery.  Men may shave face and neck.            Do not bring valuables to the hospital.            [facility] is not responsible for any belongings or valuables.  Do NOT Smoke (Tobacco/Vaping) 24 hours prior to your procedure If you use a CPAP at night, you may bring all equipment for your overnight stay.   Contacts, glasses, dentures or bridgework may not be worn into surgery.      For patients admitted to the hospital, discharge time will be determined by your treatment team.   Patients discharged the day of surgery will not be allowed to drive home, and someone needs to stay with them for 24 hours.    Special instructions:   [facility]- Preparing For Surgery  Before surgery, you can play an important role. Because skin is not sterile,  your skin needs to be as free of germs as possible. You can reduce the number of germs on your skin by washing with CHG (chlorahexidine gluconate) Soap before surgery.  CHG is an antiseptic cleaner which kills germs and bonds with the skin to continue killing germs even after washing.    Oral Hygiene is also important to reduce your risk of infection.  Remember - BRUSH YOUR TEETH THE MORNING OF SURGERY WITH YOUR REGULAR TOOTHPASTE  Please do not use if you have an allergy to CHG or antibacterial soaps. If your skin becomes reddened/irritated stop using the CHG.  Do not shave (including legs and underarms) for at least 48 hours prior to first CHG shower. It is OK to shave your face.  Please follow these instructions carefully.   Shower the NIGHT BEFORE SURGERY and the MORNING OF SURGERY with DIAL Soap.   Pat yourself dry with a CLEAN TOWEL.  Wear CLEAN PAJAMAS to bed the night before surgery  Place CLEAN SHEETS on your bed the night of your first shower and DO NOT SLEEP WITH PETS.   Day of Surgery: Please shower morning of surgery  Wear Clean/Comfortable clothing the morning of surgery  Do not apply any deodorants/lotions.   Remember to brush your teeth WITH YOUR REGULAR TOOTHPASTE.   Questions were answered. Patient verbalized understanding of instructions.

## 2024-01-24 ENCOUNTER — Encounter (HOSPITAL_COMMUNITY)
Admission: RE | Disposition: A | Discharge status: Home / Self Care | Payer: Self-pay | Source: Home / Self Care | Attending: Obstetrics and Gynecology

## 2024-01-24 ENCOUNTER — Ambulatory Visit (HOSPITAL_COMMUNITY): Admitting: Certified Registered Nurse Anesthetist

## 2024-01-24 ENCOUNTER — Encounter (HOSPITAL_COMMUNITY): Payer: Self-pay | Admitting: Obstetrics and Gynecology

## 2024-01-24 ENCOUNTER — Ambulatory Visit (HOSPITAL_COMMUNITY)
Admission: RE | Admit: 2024-01-24 | Discharge: 2024-01-24 | Disposition: A | Discharge status: Home / Self Care | Attending: Obstetrics and Gynecology | Admitting: Obstetrics and Gynecology

## 2024-01-24 ENCOUNTER — Other Ambulatory Visit: Payer: Self-pay

## 2024-01-24 DIAGNOSIS — Z87891 Personal history of nicotine dependence: Secondary | ICD-10-CM | POA: Insufficient documentation

## 2024-01-24 DIAGNOSIS — O034 Incomplete spontaneous abortion without complication: Secondary | ICD-10-CM | POA: Insufficient documentation

## 2024-01-24 DIAGNOSIS — O020 Blighted ovum and nonhydatidiform mole: Secondary | ICD-10-CM | POA: Insufficient documentation

## 2024-01-24 DIAGNOSIS — O021 Missed abortion: Secondary | ICD-10-CM | POA: Diagnosis present

## 2024-01-24 HISTORY — DX: Unspecified urinary incontinence: R32

## 2024-01-24 HISTORY — DX: Personal history of peptic ulcer disease: Z87.11

## 2024-01-24 HISTORY — PX: DILATION AND EVACUATION: SHX1459

## 2024-01-24 HISTORY — DX: Pneumonia, unspecified organism: J18.9

## 2024-01-24 HISTORY — DX: Dysuria: R30.0

## 2024-01-24 HISTORY — DX: Headache, unspecified: R51.9

## 2024-01-24 HISTORY — DX: COVID-19: U07.1

## 2024-01-24 HISTORY — DX: Interstitial cystitis (chronic) without hematuria: N30.10

## 2024-01-24 LAB — CBC
HCT: 34.9 % — ABNORMAL LOW (ref 36.0–46.0)
Hemoglobin: 11.1 g/dL — ABNORMAL LOW (ref 12.0–15.0)
MCH: 27.5 pg (ref 26.0–34.0)
MCHC: 31.8 g/dL (ref 30.0–36.0)
MCV: 86.4 fL (ref 80.0–100.0)
Platelets: 306 10*3/uL (ref 150–400)
RBC: 4.04 MIL/uL (ref 3.87–5.11)
RDW: 14.3 % (ref 11.5–15.5)
WBC: 7.6 10*3/uL (ref 4.0–10.5)
nRBC: 0 % (ref 0.0–0.2)

## 2024-01-24 SURGERY — DILATION AND EVACUATION, UTERUS
Anesthesia: General | Site: Uterus

## 2024-01-24 MED ORDER — LACTATED RINGERS IV SOLN: INTRAVENOUS | Status: DC

## 2024-01-24 MED ORDER — SODIUM CHLORIDE 0.9 % IV SOLN
INTRAVENOUS | Status: AC
  Filled 2024-01-24: qty 2

## 2024-01-24 MED ORDER — METHYLERGONOVINE MALEATE 0.2 MG/ML IJ SOLN
INTRAMUSCULAR | Status: DC | PRN
Start: 2024-01-24 — End: 2024-01-24
  Administered 2024-01-24: .2 mg via INTRAMUSCULAR

## 2024-01-24 MED ORDER — MIDAZOLAM HCL 2 MG/2ML IJ SOLN
INTRAMUSCULAR | Status: AC
Start: 2024-01-24 — End: ?
  Filled 2024-01-24: qty 2

## 2024-01-24 MED ORDER — PROPOFOL 10 MG/ML IV BOLUS
INTRAVENOUS | Status: DC | PRN
  Administered 2024-01-24: 170 mg via INTRAVENOUS

## 2024-01-24 MED ORDER — TRANEXAMIC ACID-NACL 1000-0.7 MG/100ML-% IV SOLN
INTRAVENOUS | Status: AC
  Filled 2024-01-24: qty 100

## 2024-01-24 MED ORDER — SODIUM CHLORIDE 0.9 % IV SOLN
2.0000 g | INTRAVENOUS | Status: AC
  Administered 2024-01-24: 2 g via INTRAVENOUS

## 2024-01-24 MED ORDER — FENTANYL CITRATE (PF) 100 MCG/2ML IJ SOLN
25.0000 ug | INTRAMUSCULAR | Status: DC | PRN
  Administered 2024-01-24 (×2): 50 ug via INTRAVENOUS

## 2024-01-24 MED ORDER — ACETAMINOPHEN 10 MG/ML IV SOLN
INTRAVENOUS | Status: AC
  Filled 2024-01-24: qty 100

## 2024-01-24 MED ORDER — PHENYLEPHRINE 80 MCG/ML (10ML) SYRINGE FOR IV PUSH (FOR BLOOD PRESSURE SUPPORT)
PREFILLED_SYRINGE | INTRAVENOUS | Status: DC | PRN
  Administered 2024-01-24: 160 ug via INTRAVENOUS

## 2024-01-24 MED ORDER — FENTANYL CITRATE (PF) 100 MCG/2ML IJ SOLN
INTRAMUSCULAR | Status: AC
Start: 2024-01-24 — End: ?
  Filled 2024-01-24: qty 2

## 2024-01-24 MED ORDER — 0.9 % SODIUM CHLORIDE (POUR BTL) OPTIME
TOPICAL | Status: DC | PRN
  Administered 2024-01-24: 1000 mL

## 2024-01-24 MED ORDER — CHLORHEXIDINE GLUCONATE 0.12 % MT SOLN: 15.0000 mL | Freq: Once | OROMUCOSAL | Status: AC

## 2024-01-24 MED ORDER — LIDOCAINE 2% (20 MG/ML) 5 ML SYRINGE
INTRAMUSCULAR | Status: DC | PRN
Start: 2024-01-24 — End: 2024-01-24
  Administered 2024-01-24: 60 mg via INTRAVENOUS

## 2024-01-24 MED ORDER — LIDOCAINE-EPINEPHRINE 1 %-1:100000 IJ SOLN
INTRAMUSCULAR | Status: AC
  Filled 2024-01-24: qty 1

## 2024-01-24 MED ORDER — MIDAZOLAM HCL 2 MG/2ML IJ SOLN
INTRAMUSCULAR | Status: DC | PRN
  Administered 2024-01-24: 2 mg via INTRAVENOUS

## 2024-01-24 MED ORDER — CHLORHEXIDINE GLUCONATE 0.12 % MT SOLN
OROMUCOSAL | Status: AC
  Administered 2024-01-24: 15 mL via OROMUCOSAL
  Filled 2024-01-24: qty 15

## 2024-01-24 MED ORDER — PROPOFOL 10 MG/ML IV BOLUS
INTRAVENOUS | Status: AC
  Filled 2024-01-24: qty 20

## 2024-01-24 MED ORDER — FENTANYL CITRATE (PF) 250 MCG/5ML IJ SOLN
INTRAMUSCULAR | Status: AC
Start: 2024-01-24 — End: ?
  Filled 2024-01-24: qty 5

## 2024-01-24 MED ORDER — METHYLERGONOVINE MALEATE 0.2 MG/ML IJ SOLN
INTRAMUSCULAR | Status: AC
  Filled 2024-01-24: qty 1

## 2024-01-24 MED ORDER — ORAL CARE MOUTH RINSE: 15.0000 mL | Freq: Once | OROMUCOSAL | Status: AC

## 2024-01-24 MED ORDER — POVIDONE-IODINE 10 % EX SWAB
2.0000 | Freq: Once | CUTANEOUS | Status: AC
  Administered 2024-01-24: 2 via TOPICAL

## 2024-01-24 MED ORDER — ACETAMINOPHEN 10 MG/ML IV SOLN
INTRAVENOUS | Status: DC | PRN
  Administered 2024-01-24: 1000 mg via INTRAVENOUS

## 2024-01-24 MED ORDER — FENTANYL CITRATE (PF) 250 MCG/5ML IJ SOLN
INTRAMUSCULAR | Status: DC | PRN
  Administered 2024-01-24: 50 ug via INTRAVENOUS

## 2024-01-24 MED ORDER — AMISULPRIDE (ANTIEMETIC) 5 MG/2ML IV SOLN: 10.0000 mg | Freq: Once | INTRAVENOUS | Status: DC | PRN

## 2024-01-24 MED ORDER — ONDANSETRON HCL 4 MG/2ML IJ SOLN
INTRAMUSCULAR | Status: DC | PRN
  Administered 2024-01-24: 4 mg via INTRAVENOUS

## 2024-01-24 MED ORDER — DEXAMETHASONE SODIUM PHOSPHATE 10 MG/ML IJ SOLN
INTRAMUSCULAR | Status: DC | PRN
  Administered 2024-01-24: 8 mg via INTRAVENOUS

## 2024-01-24 MED ORDER — KETOROLAC TROMETHAMINE 30 MG/ML IJ SOLN
INTRAMUSCULAR | Status: DC | PRN
  Administered 2024-01-24: 30 mg via INTRAVENOUS

## 2024-01-24 SURGICAL SUPPLY — 20 items
CATH ROBINSON RED A/P 16FR (CATHETERS) ×2 IMPLANT
COVER MAYO STAND STRL (DRAPES) ×2 IMPLANT
FILTER UTR ASPR ASSEMBLY (MISCELLANEOUS) ×2 IMPLANT
GLOVE BIOGEL PI IND STRL 7.0 (GLOVE) ×2 IMPLANT
GLOVE SURG ORTHO 8.0 STRL STRW (GLOVE) ×2 IMPLANT
GOWN STRL REUS W/ TWL LRG LVL3 (GOWN DISPOSABLE) ×4 IMPLANT
HOSE CONNECTING 18IN BERKELEY (TUBING) ×2 IMPLANT
KIT BERKELEY 1ST TRI 3/8 NO TR (MISCELLANEOUS) ×2 IMPLANT
KIT BERKELEY 1ST TRIMESTER 3/8 (MISCELLANEOUS) ×2 IMPLANT
NS IRRIG 1000ML POUR BTL (IV SOLUTION) ×2 IMPLANT
PACK VAGINAL MINOR WOMEN LF (CUSTOM PROCEDURE TRAY) ×2 IMPLANT
PAD OB MATERNITY 11 LF (PERSONAL CARE ITEMS) ×2 IMPLANT
SET BERKELEY SUCTION TUBING (SUCTIONS) ×2 IMPLANT
SPIKE FLUID TRANSFER (MISCELLANEOUS) ×2 IMPLANT
TOWEL GREEN STERILE FF (TOWEL DISPOSABLE) ×4 IMPLANT
UNDERPAD 30X36 HEAVY ABSORB (UNDERPADS AND DIAPERS) ×2 IMPLANT
VACURETTE 10 RIGID CVD (CANNULA) IMPLANT
VACURETTE 7MM CVD STRL WRAP (CANNULA) IMPLANT
VACURETTE 8 RIGID CVD (CANNULA) IMPLANT
VACURETTE 9 RIGID CVD (CANNULA) IMPLANT

## 2024-01-24 NOTE — Transfer of Care (Signed)
 Immediate Anesthesia Transfer of Care Note  Patient: Dana Giles  Procedure(s) Performed: DILATION AND EVACUATION, UTERUS (Uterus)  Patient Location: PACU  Anesthesia Type:General  Level of Consciousness: awake and drowsy  Airway & Oxygen Therapy: Patient Spontanous Breathing and Patient connected to face mask oxygen  Post-op Assessment: Report given to RN and Post -op Vital signs reviewed and stable  Post vital signs: Reviewed and stable  Last Vitals:  Vitals Value Taken Time  BP 99/52 01/24/24 0817  Temp    Pulse 75 01/24/24 0816  Resp 18 01/24/24 0818  SpO2 100 % 01/24/24 0816  Vitals shown include unfiled device data.  Last Pain:  Vitals:   01/24/24 0605  TempSrc:   PainSc: 4       Patients Stated Pain Goal: 0 (01/24/24 4098)  Complications: No notable events documented.

## 2024-01-24 NOTE — Anesthesia Procedure Notes (Signed)
 Procedure Name: LMA Insertion Date/Time: 01/24/2024 7:46 AM  Performed by: Alphia Jasmine, CRNAPre-anesthesia Checklist: Patient identified, Emergency Drugs available, Suction available, Timeout performed and Patient being monitored Patient Re-evaluated:Patient Re-evaluated prior to induction Oxygen Delivery Method: Circle system utilized Preoxygenation: Pre-oxygenation with 100% oxygen Induction Type: IV induction Ventilation: Mask ventilation without difficulty LMA: LMA inserted LMA Size: 4.0 Tube type: Oral Placement Confirmation: positive ETCO2, CO2 detector and breath sounds checked- equal and bilateral Tube secured with: Tape Dental Injury: Teeth and Oropharynx as per pre-operative assessment

## 2024-01-24 NOTE — H&P (Signed)
 Dana Giles is an 38 y.o. female. Presents for D&E due to confirmed blighted ovum.  Last week US  she is 8 + weeks EGA by sure dates but US  show 7 weeks sac without fetus.  She has had some cramping but no passage of tissue.  She desires D&E    Past Medical History:  Diagnosis Date   Anxiety attack    Bronchitis    COVID    11/2023   Dysuria    Headache    Chronic migraines   History of stomach ulcers    Interstitial cystitis    Panic attacks    Pneumonia    2023   Urinary incontinence, unspecified type     Past Surgical History:  Procedure Laterality Date   BREAST SURGERY     reduction   PILONIDAL CYST EXCISION      History reviewed. No pertinent family history.  Social History:  reports that she has quit smoking. Her smoking use included cigarettes. She has never used smokeless tobacco. She reports that she does not currently use alcohol. She reports that she does not currently use drugs.  Allergies:  Allergies  Allergen Reactions   Hepatitis B Virus Vaccines Other (See Comments)    Passed out several hours after takiing   Nsaids Other (See Comments)    Stomach ulcer   Flexeril  [Cyclobenzaprine ] Anxiety    nightmares    Medications Prior to Admission  Medication Sig Dispense Refill Last Dose/Taking   pramipexole (MIRAPEX) 0.5 MG tablet Take 0.5 mg by mouth at bedtime.   01/23/2024   Prenatal Vit-Fe Fumarate-FA (PRENATAL PO) Take 1 tablet by mouth daily.   Past Week   OnabotulinumtoxinA (BOTOX IJ) Inject 1 Dose as directed every 3 (three) months.   More than a month   rizatriptan (MAXALT) 10 MG tablet Take 10 mg by mouth as needed for migraine.   More than a month    Review of Systems  Blood pressure 100/70, pulse 78, temperature 97.9 F (36.6 C), temperature source Oral, resp. rate 16, height 5\' 4"  (1.626 m), weight 63 kg, SpO2 99%. Physical Exam Vitals and nursing note reviewed. Exam conducted with a chaperone present.  Constitutional:      Appearance:  Normal appearance.  HENT:     Head: Normocephalic.  Eyes:     Pupils: Pupils are equal, round, and reactive to light.  Cardiovascular:     Rate and Rhythm: Normal rate and regular rhythm.     Pulses: Normal pulses.  Abdominal:     General: Abdomen is Gravid, nontender Neurological:     Mental Status: She is alert. Pt wishes Full resuscitation in the event of a code.  Results for orders placed or performed during the hospital encounter of 01/24/24 (from the past 24 hours)  CBC     Status: Abnormal   Collection Time: 01/24/24  6:00 AM  Result Value Ref Range   WBC 7.6 4.0 - 10.5 K/uL   RBC 4.04 3.87 - 5.11 MIL/uL   Hemoglobin 11.1 (L) 12.0 - 15.0 g/dL   HCT 40.9 (L) 81.1 - 91.4 %   MCV 86.4 80.0 - 100.0 fL   MCH 27.5 26.0 - 34.0 pg   MCHC 31.8 30.0 - 36.0 g/dL   RDW 78.2 95.6 - 21.3 %   Platelets 306 150 - 400 K/uL   nRBC 0.0 0.0 - 0.2 %    No results found.  Assessment/Plan: IUP at 8 weeks Incomplete AB and blighted Ovum Desires  D&E.  We discussed the procedure at length.  The risks and benefits and alternative treatments were discussed.  Risks include, but not limited to, injury to bowel, bladder, uterus, tubes ovaries, risks associated with possible laparotomy, blood transfusion, and infection.  All of her questions were answered and she gives informed consent.  Denette Finner 01/24/2024, 7:35 AM

## 2024-01-24 NOTE — Op Note (Signed)
 NAME: [name], Dana Giles MEDICAL RECORD NO: [id number] ACCOUNT NO: [id number] DATE OF BIRTH: 05-20-1986 FACILITY: [facility] LOCATION: [facility] PHYSICIAN: Elonda Hale. Asencion Blacksmith, MD  Operative Report   DATE OF PROCEDURE: 01/24/2024  PREOPERATIVE DIAGNOSES: 1.  Blighted ovum. 2.  Incomplete miscarriage at [redacted] weeks gestational age.  POSTOPERATIVE DIAGNOSES: 1.  Blighted ovum. 2.  Incomplete miscarriage at [redacted] weeks gestational age.  PROCEDURE PERFORMED:  Dilation and evacuation.  SURGEON:  Elonda Hale. Asencion Blacksmith, MD  ANESTHESIA:  General by LMA.  INDICATIONS:  The patient is a 38 year old G1 at [redacted] weeks gestational age, presented to the office for ultrasound evaluation.  It showed a 7-1/2 week size gestational sac without fetus.  She is supposed to be 8+ weeks by sure dates.  The ultrasound  findings are consistent with a blighted ovum.  The patient was given options.  She began having some cramping and bleeding the next day, but never passed any fetal tissue and she desires dilation and evacuation and presents for that.  The risks and  benefits of the procedure were discussed.  Informed consent was obtained.  DESCRIPTION OF PROCEDURE:  After adequate analgesia, the patient was placed in the dorsal lithotomy position.  She was sterilely prepped and draped.  The bladder was sterilely drained.  A Graves speculum was placed.  The tenaculum was placed in the  anterior lip of the cervix.  The uterus was in a retroverted position and sounded to 10 cm.  Easily dilated to a #25 Pratt dilator.  An 8-mm suction curette was inserted and suction curettage was performed retrieving products of conception.  This was  performed until a gritty surface was felt throughout the endometrial cavity and no residual products being retrieved.  The patient was given Methergine  0.2 mg IM with good uterine response and no further products noted.  The curette was then removed.   The tenaculum was removed from the anterior lip of the cervix and  noted to be hemostatic and minimal bleeding noted.  The patient was then transferred to the recovery room in stable condition. Sponge and instrument count was normal x3.  Estimated blood  loss was less than 50 mL.  The patient did receive Cefepime preoperatively, Methergine  intraoperatively.  DISPOSITION:  The patient will be discharged home with follow-up in the office in 2-3 weeks.  Sent home with the routine instruction sheet for dilation and evacuation.   PUS D: 01/24/2024 8:27:47 am T: 01/24/2024 1:12:00 pm  JOB: [id number]/ [id number]

## 2024-01-24 NOTE — Anesthesia Preprocedure Evaluation (Signed)
 Anesthesia Evaluation    Airway Mallampati: I  TM Distance: >3 FB Neck ROM: Full    Dental  (+) Dental Advisory Given, Teeth Intact   Pulmonary neg shortness of breath, neg COPD, neg recent URI, former smoker   breath sounds clear to auscultation       Cardiovascular negative cardio ROS  Rhythm:Regular     Neuro/Psych  Headaches    GI/Hepatic negative GI ROS, Neg liver ROS,,,  Endo/Other  negative endocrine ROS    Renal/GU negative Renal ROS     Musculoskeletal negative musculoskeletal ROS (+)   MISSED AB   Abdominal   Peds  Hematology  (+) Blood dyscrasia, anemia Lab Results      Component                Value               Date                      WBC                      7.6                 01/24/2024                HGB                      11.1 (L)            01/24/2024                HCT                      34.9 (L)            01/24/2024                MCV                      86.4                01/24/2024                PLT                      306                 01/24/2024              Anesthesia Other Findings   Reproductive/Obstetrics                             Anesthesia Physical Anesthesia Plan  ASA: 2  Anesthesia Plan: General   Post-op Pain Management: Ofirmev  IV (intra-op)* and Toradol  IV (intra-op)*   Induction: Intravenous  PONV Risk Score and Plan: 3 and Ondansetron  and Dexamethasone   Airway Management Planned: LMA  Additional Equipment: None  Intra-op Plan:   Post-operative Plan: Extubation in OR  Informed Consent: I have reviewed the patients History and Physical, chart, labs and discussed the procedure including the risks, benefits and alternatives for the proposed anesthesia with the patient or authorized representative who has indicated his/her understanding and acceptance.     Dental advisory given  Plan Discussed with: CRNA  Anesthesia Plan  Comments:        Anesthesia Quick Evaluation

## 2024-01-24 NOTE — Anesthesia Postprocedure Evaluation (Signed)
 Anesthesia Post Note  Patient: Dana Giles  Procedure(s) Performed: DILATION AND EVACUATION, UTERUS (Uterus)     Patient location during evaluation: PACU Anesthesia Type: General Level of consciousness: awake and alert Pain management: pain level controlled Vital Signs Assessment: post-procedure vital signs reviewed and stable Respiratory status: spontaneous breathing, nonlabored ventilation and respiratory function stable Cardiovascular status: blood pressure returned to baseline and stable Postop Assessment: no apparent nausea or vomiting Anesthetic complications: no   No notable events documented.  Last Vitals:  Vitals:   01/24/24 0852 01/24/24 0900  BP: 93/62 (!) 91/53  Pulse: 71 63  Resp: 14 20  Temp:  36.8 C  SpO2: 100% 100%    Last Pain:  Vitals:   01/24/24 0831  TempSrc:   PainSc: 4                  Weslie Rasmus

## 2024-01-25 ENCOUNTER — Encounter (HOSPITAL_COMMUNITY): Payer: Self-pay | Admitting: Obstetrics and Gynecology

## 2024-01-25 LAB — SURGICAL PATHOLOGY

## 2024-09-25 ENCOUNTER — Encounter (HOSPITAL_COMMUNITY): Payer: Self-pay | Admitting: Obstetrics and Gynecology

## 2024-09-25 ENCOUNTER — Other Ambulatory Visit: Payer: Self-pay

## 2024-09-25 NOTE — H&P (Signed)
 Presents for surgical treatment of embryonic demise [redacted] weeks EGA  Saw Fertility specialist in [place] normal HSG and SA per pt Did clomid 100mg  and ovidrel and IUI and conceived first cycle no cramping or bleeding. No fu US  or labs presents for US  today some pregnancy sxs hx of blighted ovum earlier this year and D&E hx of TAB as teenager Patient's Care Team Notes: Dana Giles - NOV[facility] HE[facility] Patient's Pharmacies [facility] [phone] [facility]): [address], [place], [place] [address], Ph [phone], Fax [phone] [facility], [facility]): [address] [place], [place], [place] [address], Ph [phone], Fax [phone] Vitals None recorded. Allergies Reviewed Allergies CYCLOBENZAPRINE , high criticality: Anxiety, Other - Nightmares nightmares nightmares Nightmares  HEPATITIS B VIRUS VACCINE  NSAIDS (NON-STEROIDAL ANTI-INFLAMMATORY DRUG): Other - Stomach ulcer  Medications Reviewed Medications albuterol  sulfate HFA 90 mcg/actuation aerosol inhaler 07/24/19   filled surescripts Botox 200 unit injection INJECT 200 UNITS INTRAMUSCULARLY EVERY 12 WEEKS 08/02/24   filled surescripts cholecalciferol (vitamin D3) 1,250 mcg (50,000 unit) capsule TAKE 1 CAPSULE BY MOUTH WEEKLY 07/26/24   filled surescripts Clomid 50 mg tablet TAKE 2 TABLETS AT BEDTIME ON CYCLE DAYS 3-7 07/21/24   filled surescripts ondansetron  4 mg disintegrating tablet TAKE ONE TABLET BY MOUTH EVERY 8 HOURS AS NEEDED FOR NAUSEA FOR UP TO 7 DAYS. 05/24/24   filled surescripts ondansetron  HCl 8 mg tablet TAKE 1 TABLET (8 MG DOSE) BY MOUTH EVERY 8 HOURS AS NEEDED FOR UP TO 7 DAYS 09/10/24   filled surescripts pramipexole 0.5 mg tablet TAKE ONE TABLET BY MOUTH AT BEDTIME. 08/14/24   filled surescripts pregabalin 50 mg capsule TAKE 1 CAPSULE BY MOUTH 3 TIMES A DAY 09/03/24   filled surescripts Prenatal Vitamin 01/18/24   entered [name] rizatriptan 10 mg tablet TAKE ONE TABLET ONCE AS  NEEDED FOR MIGRAINE. MAY REPEAT IN 2 HOURS IF NEEDED. MAX 2/24 HOURS 09/04/24   filled surescripts Problems Reviewed Problems Migraine Family History Reviewed Family History Maternal Uncle - Arthritis Father - Malignant neoplasm of prostate Maternal Grandmother - Malignant neoplasm of lung Maternal Grandfather - Malignant neoplasm of prostate Social History Reviewed Social History Relationships and Sexuality What is your relationship status?: Committed Partner(s) Spouses/Partners Name:: Dana Giles Spouses/Partners Assigned sex a birth:: Female Spouses/Partners Identify as:: Identify as Female Are you sexually active?: Yes Substance Use Do you or have you ever smoked tobacco?: Former smoker What is your level of alcohol consumption?: None Do you use any illicit or recreational drugs?: No Which illicit or recreational drugs have you used?: None What is your level of caffeine consumption?: Occasional Diet and Exercise What type of diet are you following?: Regular What is your exercise level?: Occasional Do you engage in light exercise (e.g. light walking, housework, etc)?: Yes How often are you engaging in light exercise?: 4-6 times per week Education and Occupation What is the highest grade or level of school you have completed or the highest degree you have received?: Associate degree: academic program Are you currently in school?: No Are you currently employed?: Yes Advance Directive Do you have an advance directive?: No Is blood transfusion acceptable in an emergency?: Yes Routine Gyn History of domestic violence: No Other Performs monthly self-breast exam?: N General stress level: Medium Gender Identity and LGBTQ Identity Assigned sex at birth: Unknown Sexual orientation: Straight or heterosexual Surgical & Procedure History Reviewed Surgical & Procedure History Carpal tunnel surgery - 05/18/2018 Breast reduction - 09/07/2001  GYN History Reviewed GYN  History Date of LMP: 07/20/2024. Date of Last Pap Smear: 07/20/2022. History of Abnormal PAP: N. HPV Vaccine: Not Completed. Date of Last Mammogram: 11/06/2022. Date of Last Colonoscopy: 11/30/2019. History of Cervical Dysplasia: N. History of Vulvar Dysplasia: N. Sexually Active: Y. Age at first intercourse: 27. Total lifetime partners: 5 or more. History of Sexually Transmitted Infection: N. Do you have history of sexual trauma?: N. Current Birth Control Method:: None. Post Menopausal Hormone Therapy User: Never. History of Endometriosis: N. History of Fibroids: N. History of Infertility: N. History of Recurrent Ovarian Cysts: Y. History of PCOS: N. History of Dysmenorrhea: N. Diethylstilbestrol (DES) exposed daughters of women who took DES during pregnancy?: N. Obstetric History Reviewed Obstetric History TOTAL FULL PRE AB. I AB. S ECTOPICS MULTIPLE LIVING 2    2   0 Past Medical History Reviewed Past Medical History GI- Reflux/Ulcers: Y Neurology- Headaches/Migraines: Y Rheumatology- Restless Leg Syndrome: Y Screening None recorded. HPI Pregnancy Confirmation [facility]) Reported by patient. LMP: 11/13/2025definite Menstrual History frequency of menses: monthly * Context: current contraceptive method: None; spontaneous conception; assisted conception intrauterine insemination (08/02/2024); prior failed pregnancy * Associated Signs & Symptoms nausea (mild); breast tenderness; fatigue; bloating Ultrasound Confirmation: Date:09/21/2024 Intrauterine: yes FHT: no 6wks 4days Gestation:single (missed AB) ROS   ROS as noted in the HPI Physical Exam Vitals and nursing note reviewed. Exam conducted with a chaperone present.  Constitutional:      Appearance: Normal appearance.  HENT:     Head: Normocephalic.  Eyes:     Pupils: Pupils are equal, round, and reactive to light.  Cardiovascular:     Rate and Rhythm: Normal rate and regular rhythm.     Pulses: Normal pulses.   Abdominal:     General: Abdomen is Gravid, nontender Neurological:     Mental Status: She is alert. Pt wishes Full resuscitation in the event of a code. US  shows IUP with S<D and no FHR at 6 5/7 weeks sizec/w embryonic demise Assessment / Plan 1.  Secondary amenorrhea  N91.1: Secondary amenorrhea US , TRANSVAGINAL  2.  Embryonic demise  Pt counseled on TVUS results today and diagnosis of MAB. Discussed that it is unlikely to be her fault nor could she have prevented it. Reviewed that this miscarriage does not likely reflect her ability to have a successful pregnancy in the future, and that miscarriage is common 20% of pregnancies. She was counseled on options for managing missed ab including expectant, medical, and surgical management.  discussed that this is second SAB and wants to proceed with D&E and genetics studies discussed doing workup for SAB especially with ama and embryonic demise rec she get in touch with fertility clinic and see what type of labs were done and whether they would want to order SAB labs and workup  We discussed the procedure at length.  The risks and benefits and alternative treatments were discussed.  Risks include, but not limited to, injury to bowel, bladder, uterus, tubes ovaries, risks associated with possible laparotomy, blood transfusion, and infection.  All of her questions were answered and she gives informed consent.  This patient has been seen and examined.   All of her questions were answered.  Labs and vital signs reviewed.  Informed consent has been obtained.  The History and Physical is current.

## 2024-09-26 ENCOUNTER — Encounter (HOSPITAL_COMMUNITY): Payer: Self-pay | Admitting: Obstetrics and Gynecology

## 2024-09-26 ENCOUNTER — Ambulatory Visit (HOSPITAL_COMMUNITY): Admitting: Anesthesiology

## 2024-09-26 ENCOUNTER — Ambulatory Visit (HOSPITAL_COMMUNITY)
Admission: RE | Admit: 2024-09-26 | Discharge: 2024-09-26 | Disposition: A | Discharge status: Home / Self Care | Attending: Obstetrics and Gynecology | Admitting: Obstetrics and Gynecology

## 2024-09-26 ENCOUNTER — Encounter (HOSPITAL_COMMUNITY)
Admission: RE | Disposition: A | Discharge status: Home / Self Care | Payer: Self-pay | Source: Home / Self Care | Attending: Obstetrics and Gynecology

## 2024-09-26 DIAGNOSIS — N96 Recurrent pregnancy loss: Secondary | ICD-10-CM

## 2024-09-26 DIAGNOSIS — Z87891 Personal history of nicotine dependence: Secondary | ICD-10-CM | POA: Diagnosis not present

## 2024-09-26 DIAGNOSIS — Z3A09 9 weeks gestation of pregnancy: Secondary | ICD-10-CM | POA: Diagnosis not present

## 2024-09-26 DIAGNOSIS — Z01818 Encounter for other preprocedural examination: Secondary | ICD-10-CM

## 2024-09-26 DIAGNOSIS — F419 Anxiety disorder, unspecified: Secondary | ICD-10-CM | POA: Insufficient documentation

## 2024-09-26 DIAGNOSIS — N911 Secondary amenorrhea: Secondary | ICD-10-CM | POA: Diagnosis not present

## 2024-09-26 DIAGNOSIS — O021 Missed abortion: Secondary | ICD-10-CM | POA: Diagnosis present

## 2024-09-26 DIAGNOSIS — F32A Depression, unspecified: Secondary | ICD-10-CM | POA: Diagnosis not present

## 2024-09-26 HISTORY — PX: DILATION AND EVACUATION: SHX1459

## 2024-09-26 LAB — TYPE AND SCREEN
ABO/RH(D): B POS
Antibody Screen: NEGATIVE

## 2024-09-26 LAB — ABO/RH: ABO/RH(D): B POS

## 2024-09-26 LAB — CBC
HCT: 37.7 % (ref 36.0–46.0)
Hemoglobin: 12.5 g/dL (ref 12.0–15.0)
MCH: 29.3 pg (ref 26.0–34.0)
MCHC: 33.2 g/dL (ref 30.0–36.0)
MCV: 88.3 fL (ref 80.0–100.0)
Platelets: 371 K/uL (ref 150–400)
RBC: 4.27 MIL/uL (ref 3.87–5.11)
RDW: 13.1 % (ref 11.5–15.5)
WBC: 8.6 K/uL (ref 4.0–10.5)
nRBC: 0 % (ref 0.0–0.2)

## 2024-09-26 MED ORDER — FENTANYL CITRATE (PF) 100 MCG/2ML IJ SOLN
INTRAMUSCULAR | Status: AC
  Filled 2024-09-26: qty 2

## 2024-09-26 MED ORDER — ONDANSETRON HCL 4 MG/2ML IJ SOLN
INTRAMUSCULAR | Status: DC | PRN
  Administered 2024-09-26: 4 mg via INTRAVENOUS

## 2024-09-26 MED ORDER — TRANEXAMIC ACID-NACL 1000-0.7 MG/100ML-% IV SOLN
INTRAVENOUS | Status: DC | PRN
  Administered 2024-09-26: 1000 mg via INTRAVENOUS

## 2024-09-26 MED ORDER — LIDOCAINE-EPINEPHRINE 1 %-1:100000 IJ SOLN
INTRAMUSCULAR | Status: DC | PRN
  Administered 2024-09-26: 20 mL

## 2024-09-26 MED ORDER — OXYCODONE HCL 5 MG PO TABS: 5.0000 mg | ORAL_TABLET | Freq: Once | ORAL | Status: DC | PRN

## 2024-09-26 MED ORDER — PROPOFOL 10 MG/ML IV BOLUS
INTRAVENOUS | Status: AC
  Filled 2024-09-26: qty 20

## 2024-09-26 MED ORDER — FENTANYL CITRATE (PF) 250 MCG/5ML IJ SOLN
INTRAMUSCULAR | Status: DC | PRN
  Administered 2024-09-26 (×2): 50 ug via INTRAVENOUS

## 2024-09-26 MED ORDER — ONDANSETRON HCL 4 MG/2ML IJ SOLN
INTRAMUSCULAR | Status: AC
  Filled 2024-09-26: qty 2

## 2024-09-26 MED ORDER — MIDAZOLAM HCL (PF) 2 MG/2ML IJ SOLN
INTRAMUSCULAR | Status: DC | PRN
  Administered 2024-09-26: 2 mg via INTRAVENOUS

## 2024-09-26 MED ORDER — PHENYLEPHRINE 80 MCG/ML (10ML) SYRINGE FOR IV PUSH (FOR BLOOD PRESSURE SUPPORT)
PREFILLED_SYRINGE | INTRAVENOUS | Status: AC
  Filled 2024-09-26: qty 10

## 2024-09-26 MED ORDER — CHLORHEXIDINE GLUCONATE 0.12 % MT SOLN
OROMUCOSAL | Status: AC
  Filled 2024-09-26: qty 15

## 2024-09-26 MED ORDER — ORAL CARE MOUTH RINSE: 15.0000 mL | Freq: Once | OROMUCOSAL | Status: AC

## 2024-09-26 MED ORDER — AMISULPRIDE (ANTIEMETIC) 5 MG/2ML IV SOLN: 10.0000 mg | Freq: Once | INTRAVENOUS | Status: DC | PRN

## 2024-09-26 MED ORDER — POVIDONE-IODINE 10 % EX SWAB: 2.0000 | Freq: Once | CUTANEOUS | Status: DC

## 2024-09-26 MED ORDER — OXYCODONE HCL 5 MG/5ML PO SOLN: 5.0000 mg | Freq: Once | ORAL | Status: DC | PRN

## 2024-09-26 MED ORDER — DEXAMETHASONE SOD PHOSPHATE PF 10 MG/ML IJ SOLN
INTRAMUSCULAR | Status: AC
  Filled 2024-09-26: qty 1

## 2024-09-26 MED ORDER — KETOROLAC TROMETHAMINE 30 MG/ML IJ SOLN
INTRAMUSCULAR | Status: DC | PRN
  Administered 2024-09-26: 30 mg via INTRAVENOUS

## 2024-09-26 MED ORDER — KETOROLAC TROMETHAMINE 30 MG/ML IJ SOLN
INTRAMUSCULAR | Status: AC
  Filled 2024-09-26: qty 1

## 2024-09-26 MED ORDER — MIDAZOLAM HCL 2 MG/2ML IJ SOLN
INTRAMUSCULAR | Status: AC
  Filled 2024-09-26: qty 2

## 2024-09-26 MED ORDER — CHLORHEXIDINE GLUCONATE 0.12 % MT SOLN
15.0000 mL | Freq: Once | OROMUCOSAL | Status: AC
  Administered 2024-09-26: 15 mL via OROMUCOSAL

## 2024-09-26 MED ORDER — DEXAMETHASONE SOD PHOSPHATE PF 10 MG/ML IJ SOLN
INTRAMUSCULAR | Status: DC | PRN
  Administered 2024-09-26: 10 mg via INTRAVENOUS

## 2024-09-26 MED ORDER — LIDOCAINE 2% (20 MG/ML) 5 ML SYRINGE
INTRAMUSCULAR | Status: DC | PRN
  Administered 2024-09-26: 60 mg via INTRAVENOUS

## 2024-09-26 MED ORDER — HYDROMORPHONE HCL 1 MG/ML IJ SOLN: 0.2500 mg | INTRAMUSCULAR | Status: DC | PRN

## 2024-09-26 MED ORDER — LACTATED RINGERS IV SOLN: INTRAVENOUS | Status: DC

## 2024-09-26 MED ORDER — SODIUM CHLORIDE 0.9 % IV SOLN
2.0000 g | INTRAVENOUS | Status: AC
  Administered 2024-09-26: 2 g via INTRAVENOUS

## 2024-09-26 MED ORDER — MEPERIDINE HCL 25 MG/ML IJ SOLN: 6.2500 mg | INTRAMUSCULAR | Status: DC | PRN

## 2024-09-26 MED ORDER — SODIUM CHLORIDE 0.9 % IV SOLN
INTRAVENOUS | Status: AC
  Filled 2024-09-26: qty 2

## 2024-09-26 MED ORDER — PROPOFOL 10 MG/ML IV BOLUS
INTRAVENOUS | Status: DC | PRN
  Administered 2024-09-26: 200 mg via INTRAVENOUS

## 2024-09-26 MED ORDER — 0.9 % SODIUM CHLORIDE (POUR BTL) OPTIME
TOPICAL | Status: DC | PRN
  Administered 2024-09-26: 1000 mL

## 2024-09-26 MED ORDER — LIDOCAINE 2% (20 MG/ML) 5 ML SYRINGE
INTRAMUSCULAR | Status: AC
  Filled 2024-09-26: qty 5

## 2024-09-26 MED ORDER — DEXMEDETOMIDINE HCL IN NACL 80 MCG/20ML IV SOLN
INTRAVENOUS | Status: DC | PRN
  Administered 2024-09-26: 8 ug via INTRAVENOUS

## 2024-09-26 MED ORDER — METHYLERGONOVINE MALEATE 0.2 MG/ML IJ SOLN
INTRAMUSCULAR | Status: DC | PRN
  Administered 2024-09-26: .2 mg via INTRAMUSCULAR

## 2024-09-26 NOTE — Anesthesia Preprocedure Evaluation (Deleted)
 "                                  Anesthesia Evaluation  Patient identified by MRN, date of birth, ID band Patient awake    Reviewed: Allergy & Precautions, H&P , NPO status , Patient's Chart, lab work & pertinent test results  Airway Mallampati: I  TM Distance: >3 FB Neck ROM: Full    Dental  (+) Dental Advisory Given, Teeth Intact   Pulmonary neg pulmonary ROS, neg shortness of breath, neg COPD, neg recent URI, former smoker   breath sounds clear to auscultation       Cardiovascular negative cardio ROS  Rhythm:Regular     Neuro/Psych  Headaches  Anxiety Depression    negative neurological ROS  negative psych ROS   GI/Hepatic negative GI ROS, Neg liver ROS,,,  Endo/Other  negative endocrine ROS    Renal/GU negative Renal ROS  negative genitourinary   Musculoskeletal negative musculoskeletal ROS (+)   MISSED AB   Abdominal   Peds negative pediatric ROS (+)  Hematology negative hematology ROS (+) Blood dyscrasia, anemia Lab Results      Component                Value               Date                      WBC                      7.6                 01/24/2024                HGB                      11.1 (L)            01/24/2024                HCT                      34.9 (L)            01/24/2024                MCV                      86.4                01/24/2024                PLT                      306                 01/24/2024              Anesthesia Other Findings   Reproductive/Obstetrics negative OB ROS                              Anesthesia Physical Anesthesia Plan  ASA: 2  Anesthesia Plan: General   Post-op Pain Management:    Induction: Intravenous  PONV Risk Score and Plan: 3 and Ondansetron , Dexamethasone , Midazolam  and Treatment may vary  due to age or medical condition  Airway Management Planned: LMA  Additional Equipment: None  Intra-op Plan:   Post-operative Plan: Extubation in  OR  Informed Consent: I have reviewed the patients History and Physical, chart, labs and discussed the procedure including the risks, benefits and alternatives for the proposed anesthesia with the patient or authorized representative who has indicated his/her understanding and acceptance.     Dental advisory given  Plan Discussed with: CRNA  Anesthesia Plan Comments:         Anesthesia Quick Evaluation  "

## 2024-09-26 NOTE — Anesthesia Postprocedure Evaluation (Signed)
"   Anesthesia Post Note  Patient: Dana Giles  Procedure(s) Performed: DILATION AND EVACUATION, UTERUS WITH CHROMOSOME STUDIES     Patient location during evaluation: PACU Anesthesia Type: General Level of consciousness: awake and alert Pain management: pain level controlled Vital Signs Assessment: post-procedure vital signs reviewed and stable Respiratory status: spontaneous breathing, nonlabored ventilation and respiratory function stable Cardiovascular status: blood pressure returned to baseline and stable Postop Assessment: no apparent nausea or vomiting Anesthetic complications: no   No notable events documented.  Last Vitals:  Vitals:   09/26/24 0900 09/26/24 0915  BP: 99/61 103/61  Pulse:    Resp: 13 14  Temp:  36.4 C  SpO2:  97%    Last Pain:  Vitals:   09/26/24 0845  TempSrc:   PainSc: 0-No pain                 Butler Levander Pinal      "

## 2024-09-26 NOTE — Anesthesia Procedure Notes (Signed)
 Procedure Name: LMA Insertion Date/Time: 09/26/2024 7:46 AM  Performed by: Hedy Jarred, CRNAPre-anesthesia Checklist: Patient identified, Emergency Drugs available, Suction available and Patient being monitored Patient Re-evaluated:Patient Re-evaluated prior to induction Oxygen Delivery Method: Circle System Utilized Preoxygenation: Pre-oxygenation with 100% oxygen Induction Type: IV induction Ventilation: Mask ventilation without difficulty LMA: LMA inserted LMA Size: 4.0 Number of attempts: 1 Airway Equipment and Method: Bite block Placement Confirmation: positive ETCO2 Tube secured with: Tape Dental Injury: Teeth and Oropharynx as per pre-operative assessment

## 2024-09-26 NOTE — Op Note (Signed)
 NAME: Dana Giles, Dana Giles MEDICAL RECORD NO: [id number] ACCOUNT NO: [id number] DATE OF BIRTH: 11/28/1985 FACILITY: [facility] LOCATION: [facility] PHYSICIAN: Alm BROCKS. Marget, MD  Operative Report   DATE OF PROCEDURE: 09/26/2024  PREOPERATIVE DIAGNOSIS:  Embryonic demise at [redacted] weeks gestational age.  POSTOPERATIVE DIAGNOSIS:  Embryonic demise at [redacted] weeks gestational age.  PROCEDURE PERFORMED:  Dilation and evacuation with genetics of tissue.  SURGEON:  Alm BROCKS. Marget, MD  ANESTHESIA:  General by LMA.  INDICATIONS:  The patient is a 39 year old G3 P0 at [redacted] weeks gestational age who presented to my office for routine ultrasound evaluation of early pregnancy.  Had a 6.[redacted] week gestational size fetus with no fetal heart activity consistent with the embryonic  demise.  She desires dilation and evacuation and tissue sent for chromosome analysis.  Risk and benefits of the procedure were discussed at length and informed consent was obtained.  DESCRIPTION OF PROCEDURE:  After adequate analgesia, the patient was placed in the dorsal lithotomy position.  She was sterilely prepped and draped.  The bladder was sterilely drained.  A Graves speculum was placed.  A tenaculum was placed on the  anterior lip of the cervix.  Paracervical block was placed with 1% Xylocaine  with 1:100,000 epinephrine , total 20 mL used.  The uterus was sounded to 12 centimeters in a retroverted position, easily dilated to a number 27 Pratt dilator.  An 8 millimeter  suction curette was inserted and suction curettage was performed until all tissue had been removed and a gritty surface was felt throughout the endometrial cavity.  The patient was given Methergine  0.2 milligrams IM with a good uterine response.  The  curette was removed, tenaculum removed, and cervix noted to be hemostatic.  Bimanual revealed a 6 week size uterus which was firm and with no further bleeding.  The patient was then transferred to the recovery room in a stable condition.   Sponge and  instrument count was normal x 3.  ESTIMATED BLOOD LOSS:  100 mL.  The patient received 2 grams of Cefotan  preoperatively, 1 gram of TXA intraoperatively, and 0.2 milligrams of Methergine  postoperatively.  DISPOSITION:  The patient will be discharged home with routine instruction sheet for D and E told to return to the office for followup in the next 2-3 weeks.    MUK D: 09/26/2024 8:03:54 am T: 09/26/2024 8:14:00 am  JOB: [id number]/ [id number]

## 2024-09-26 NOTE — Anesthesia Preprocedure Evaluation (Signed)
"                                    Anesthesia Evaluation  Patient identified by MRN, date of birth, ID band Patient awake    Reviewed: Allergy & Precautions, H&P , NPO status , Patient's Chart, lab work & pertinent test results  Airway Mallampati: II  TM Distance: >3 FB Neck ROM: Full    Dental  (+) Dental Advisory Given   Pulmonary neg pulmonary ROS, former smoker   Pulmonary exam normal breath sounds clear to auscultation       Cardiovascular negative cardio ROS Normal cardiovascular exam Rhythm:Regular Rate:Normal     Neuro/Psych  Headaches  Anxiety Depression     negative psych ROS   GI/Hepatic negative GI ROS, Neg liver ROS,,,  Endo/Other  negative endocrine ROS    Renal/GU negative Renal ROS  negative genitourinary   Musculoskeletal negative musculoskeletal ROS (+)    Abdominal   Peds negative pediatric ROS (+)  Hematology negative hematology ROS (+)   Anesthesia Other Findings   Reproductive/Obstetrics negative OB ROS                              Anesthesia Physical Anesthesia Plan  ASA: 2  Anesthesia Plan: General   Post-op Pain Management:    Induction: Intravenous  PONV Risk Score and Plan: 3 and Ondansetron , Dexamethasone , Midazolam  and Treatment may vary due to age or medical condition  Airway Management Planned: LMA  Additional Equipment:   Intra-op Plan:   Post-operative Plan: Extubation in OR  Informed Consent: I have reviewed the patients History and Physical, chart, labs and discussed the procedure including the risks, benefits and alternatives for the proposed anesthesia with the patient or authorized representative who has indicated his/her understanding and acceptance.     Dental advisory given  Plan Discussed with: CRNA  Anesthesia Plan Comments:         Anesthesia Quick Evaluation  "

## 2024-09-26 NOTE — Transfer of Care (Signed)
 Immediate Anesthesia Transfer of Care Note  Patient: Dana Giles  Procedure(s) Performed: DILATION AND EVACUATION, UTERUS WITH CHROMOSOME STUDIES  Patient Location: PACU  Anesthesia Type:General  Level of Consciousness: drowsy  Airway & Oxygen Therapy: Patient Spontanous Breathing and Patient connected to face mask oxygen  Post-op Assessment: Report given to RN and Post -op Vital signs reviewed and stable  Post vital signs: Reviewed and stable  Last Vitals:  Vitals Value Taken Time  BP 94/57 09/26/24 08:15  Temp 36.4 C 09/26/24 08:15  Pulse 75 09/26/24 08:19  Resp 15 09/26/24 08:19  SpO2 99 % 09/26/24 08:19  Vitals shown include unfiled device data.  Last Pain:  Vitals:   09/26/24 0815  TempSrc:   PainSc: Asleep      Patients Stated Pain Goal: 5 (09/26/24 9394)  Complications: No notable events documented.

## 2024-09-27 ENCOUNTER — Encounter (HOSPITAL_COMMUNITY): Payer: Self-pay | Admitting: Obstetrics and Gynecology

## 2024-09-27 LAB — SURGICAL PATHOLOGY

## 2024-10-10 LAB — ANORA MISCARRIAGE TEST - FRESH
